# Patient Record
Sex: Female | Born: 1995
Health system: Southern US, Community
[De-identification: ages and names within clinical notes are randomized; demographics above are authoritative.]

## PROBLEM LIST (undated history)

## (undated) ENCOUNTER — Inpatient Hospital Stay (HOSPITAL_COMMUNITY): Payer: Self-pay

## (undated) ENCOUNTER — Emergency Department (HOSPITAL_BASED_OUTPATIENT_CLINIC_OR_DEPARTMENT_OTHER): Payer: Self-pay | Source: Home / Self Care

## (undated) DIAGNOSIS — Z8619 Personal history of other infectious and parasitic diseases: Secondary | ICD-10-CM

## (undated) HISTORY — DX: Personal history of other infectious and parasitic diseases: Z86.19

---

## 2016-12-01 ENCOUNTER — Ambulatory Visit: Payer: Self-pay | Admitting: Certified Nurse Midwife

## 2016-12-08 ENCOUNTER — Ambulatory Visit (INDEPENDENT_AMBULATORY_CARE_PROVIDER_SITE_OTHER): Payer: BLUE CROSS/BLUE SHIELD | Admitting: Certified Nurse Midwife

## 2016-12-08 ENCOUNTER — Encounter: Payer: Self-pay | Admitting: Certified Nurse Midwife

## 2016-12-08 VITALS — BP 125/73 | HR 93 | Ht 67.0 in | Wt 160.0 lb

## 2016-12-08 DIAGNOSIS — N926 Irregular menstruation, unspecified: Secondary | ICD-10-CM

## 2016-12-08 DIAGNOSIS — Z124 Encounter for screening for malignant neoplasm of cervix: Secondary | ICD-10-CM | POA: Diagnosis not present

## 2016-12-08 DIAGNOSIS — Z30011 Encounter for initial prescription of contraceptive pills: Secondary | ICD-10-CM

## 2016-12-08 DIAGNOSIS — Z01419 Encounter for gynecological examination (general) (routine) without abnormal findings: Secondary | ICD-10-CM

## 2016-12-08 DIAGNOSIS — N898 Other specified noninflammatory disorders of vagina: Secondary | ICD-10-CM

## 2016-12-08 DIAGNOSIS — Z3202 Encounter for pregnancy test, result negative: Secondary | ICD-10-CM | POA: Diagnosis not present

## 2016-12-08 DIAGNOSIS — Z113 Encounter for screening for infections with a predominantly sexual mode of transmission: Secondary | ICD-10-CM | POA: Diagnosis not present

## 2016-12-08 LAB — POCT URINE PREGNANCY: PREG TEST UR: NEGATIVE

## 2016-12-08 MED ORDER — NORETHIN-ETH ESTRAD-FE BIPHAS 1 MG-10 MCG / 10 MCG PO TABS: 1.0000 | ORAL_TABLET | Freq: Every day | ORAL | 4 refills | Status: DC

## 2016-12-08 NOTE — Progress Notes (Signed)
Pt would like to discuss BC options.   

## 2016-12-09 NOTE — Progress Notes (Signed)
Subjective:        Allison Ryan is a 21 y.o. female here for a routine exam.  Current complaints: irregular periods since starting to have cycles at the age of 21 years.  States that they occur around every 6 weeks and last 3-9 days with normal bleeding when they occur.  Denies any dysmenorrhea/clots, etc.  States that she is sexually active, uses condoms.  Does not want children right now.  Declines IUD/Nexplanon/Nuva Ring.  Has not been evaluated for her irregular periods or late menarche in the past.  Has not had a pap smear before.  Does report change in vaginal discharge.  STD prevention given.    Reviewed all forms of birth control options available including abstinence; over the counter/barrier methods; hormonal contraceptive medication including pill, patch, ring, injection,contraceptive implant; hormonal and nonhormonal IUDs; permanent sterilization options including vasectomy and the various tubal sterilization modalities. Risks and benefits reviewed.  Questions were answered.  Information was given to patient to review.      Personal health questionnaire:  Is patient Ashkenazi Jewish, have a family history of breast and/or ovarian cancer: no Is there a family history of uterine cancer diagnosed at age < 6350, gastrointestinal cancer, urinary tract cancer, family member who is a Personnel officerLynch syndrome-associated carrier: no Is the patient overweight and hypertensive, family history of diabetes, personal history of gestational diabetes, preeclampsia or PCOS: no Is patient over 4655, have PCOS,  family history of premature CHD under age 21, diabetes, smoke, have hypertension or peripheral artery disease:  no At any time, has a partner hit, kicked or otherwise hurt or frightened you?: no Over the past 2 weeks, have you felt down, depressed or hopeless?: no Over the past 2 weeks, have you felt little interest or pleasure in doing things?:no   Gynecologic History Patient's last menstrual period  was 11/28/2016. Contraception: condoms Last Pap: n/a 1st pap today. States that her sister had cervical cancer? Cells removed sounds like colposcopy Last mammogram: n/a <21 years, no significant hx.   Obstetric History OB History  Gravida Para Term Preterm AB Living  0 0 0 0 0 0  SAB TAB Ectopic Multiple Live Births  0 0 0 0 0        Past Medical History:  Diagnosis Date  . History of chlamydia     History reviewed. No pertinent surgical history.   Current Outpatient Medications:  .  Norethindrone-Ethinyl Estradiol-Fe Biphas (LO LOESTRIN FE) 1 MG-10 MCG / 10 MCG tablet, Take 1 tablet by mouth daily. Take 1 tablet by mouth daily., Disp: 3 Package, Rfl: 4 Not on File  Social History   Tobacco Use  . Smoking status: Never Smoker  . Smokeless tobacco: Never Used  Substance Use Topics  . Alcohol use: Yes    Family History  Problem Relation Age of Onset  . Hypertension Paternal Grandmother       Review of Systems  Constitutional: negative for fatigue and weight loss Respiratory: negative for cough and wheezing Cardiovascular: negative for chest pain, fatigue and palpitations Gastrointestinal: negative for abdominal pain and change in bowel habits Musculoskeletal:negative for myalgias Neurological: negative for gait problems and tremors Behavioral/Psych: negative for abusive relationship, depression Endocrine: negative for temperature intolerance    Genitourinary:negative for abnormal menstrual periods, genital lesions, hot flashes, sexual problems and vaginal discharge Integument/breast: negative for breast lump, breast tenderness, nipple discharge and skin lesion(s)    Objective:       BP 125/73  Pulse 93   Ht 5\' 7"  (1.702 m)   Wt 160 lb (72.6 kg)   LMP 11/28/2016   BMI 25.06 kg/m  General:   alert  Skin:   no rash or abnormalities  Lungs:   clear to auscultation bilaterally  Heart:   regular rate and rhythm, S1, S2 normal, no murmur, click, rub or gallop   Breasts:   normal without suspicious masses, skin or nipple changes or axillary nodes  Abdomen:  normal findings: no organomegaly, soft, non-tender and no hernia  Pelvis:  External genitalia: normal general appearance Urinary system: urethral meatus normal and bladder without fullness, nontender Vaginal: normal without tenderness, induration or masses Cervix: normal appearance Adnexa: normal bimanual exam Uterus: anteverted and non-tender, normal size   Lab Review Urine pregnancy test Labs reviewed yes Radiologic studies reviewed no  50% of 45 min visit spent on counseling and coordination of care.   Assessment & Plan    Healthy female exam.   R/O PCOS  1. Well woman exam   - Cytology - PAP - POCT urine pregnancy  2. Vaginal discharge     - Cervicovaginal ancillary only  3. Screen for STD (sexually transmitted disease)   - Cervicovaginal ancillary only - HIV antibody - Hepatitis B surface antigen - RPR - Hepatitis C antibody  4. Irregular periods   - TSH - Prolactin - Testosterone, Free, Total, SHBG - 17-Hydroxyprogesterone - Progesterone - Hemoglobin A1c - CBC with Differential/Platelet - Comprehensive metabolic panel - US PELVIC COMPLETE WITH TRANSVAGINAL; Future - Norethindrone-Ethinyl Estradiol-Fe Biphas (LO LOESTRIN FE) 1 MG-10 MCG / 10 MCG tablet; Take 1 tablet by mouth daily. Take 1 tablet by mouth daily.  Dispense: 3 Package; Refill: 4  5. Encounter for initial prescription of contraceptive pills   - Norethindrone-Ethinyl Estradiol-Fe Biphas (LO LOESTRIN FE) 1 MG-10 MCG / 10 MCG tablet; Take 1 tablet by mouth daily. Take 1 tablet by mouth daily.  Dispense: 3 Package; Refill: 4 - POCT urine pregnancy   Education reviewed: calcium supplements, depression evaluation, low fat, low cholesterol diet, safe sex/STD prevention, self breast exams, skin cancer screening and weight bearing exercise. Contraception: OCP (estrogen/progesterone). Follow up in: 3  months.   Meds ordered this encounter  Medications  . Norethindrone-Ethinyl Estradiol-Fe Biphas (LO LOESTRIN FE) 1 MG-10 MCG / 10 MCG tablet    Sig: Take 1 tablet by mouth daily. Take 1 tablet by mouth daily.    Dispense:  3 Package    Refill:  4    BIN # [id number], PCN# [id number], [id number], ID#: [id number]: [id number]   Orders Placed This Encounter  Procedures  . US PELVIC COMPLETE WITH TRANSVAGINAL    Standing Status:   Future    Standing Expiration Date:   02/08/2018    Order Specific Question:   Reason for Exam (SYMPTOM  OR DIAGNOSIS REQUIRED)    Answer:   irregular periods, late onset of menses    Order Specific Question:   Preferred [facility]?    [facility]  . HIV antibody  . Hepatitis B surface antigen  . RPR  . Hepatitis C antibody  . TSH  . Prolactin  . Testosterone, Free, Total, SHBG  . 17-Hydroxyprogesterone  . Progesterone  . Hemoglobin A1c  . CBC with Differential/Platelet  . Comprehensive metabolic panel  . POCT urine pregnancy    Possible management options include:Endocrinology consult for late menarche Follow up in 3 months for surveillance of OCPs/f/u lab work/US.

## 2016-12-13 LAB — CERVICOVAGINAL ANCILLARY ONLY
Bacterial vaginitis: NEGATIVE
CANDIDA VAGINITIS: NEGATIVE
CHLAMYDIA, DNA PROBE: NEGATIVE
Neisseria Gonorrhea: NEGATIVE
Trichomonas: NEGATIVE

## 2016-12-13 LAB — CYTOLOGY - PAP: DIAGNOSIS: NEGATIVE

## 2016-12-14 ENCOUNTER — Other Ambulatory Visit: Payer: Self-pay | Admitting: Certified Nurse Midwife

## 2016-12-14 ENCOUNTER — Ambulatory Visit (HOSPITAL_COMMUNITY)
Admission: RE | Admit: 2016-12-14 | Discharge: 2016-12-14 | Disposition: A | Discharge status: Home / Self Care | Payer: BLUE CROSS/BLUE SHIELD | Source: Ambulatory Visit | Attending: Certified Nurse Midwife | Admitting: Certified Nurse Midwife

## 2016-12-14 DIAGNOSIS — N926 Irregular menstruation, unspecified: Secondary | ICD-10-CM | POA: Diagnosis present

## 2016-12-16 LAB — CBC WITH DIFFERENTIAL/PLATELET
BASOS ABS: 0 10*3/uL (ref 0.0–0.2)
BASOS: 0 %
EOS (ABSOLUTE): 0.1 10*3/uL (ref 0.0–0.4)
Eos: 1 %
HEMATOCRIT: 41.5 % (ref 34.0–46.6)
Hemoglobin: 14.3 g/dL (ref 11.1–15.9)
Immature Grans (Abs): 0 10*3/uL (ref 0.0–0.1)
Immature Granulocytes: 0 %
LYMPHS ABS: 2.9 10*3/uL (ref 0.7–3.1)
Lymphs: 45 %
MCH: 30.1 pg (ref 26.6–33.0)
MCHC: 34.5 g/dL (ref 31.5–35.7)
MCV: 87 fL (ref 79–97)
MONOCYTES: 5 %
Monocytes Absolute: 0.3 10*3/uL (ref 0.1–0.9)
Neutrophils Absolute: 3.1 10*3/uL (ref 1.4–7.0)
Neutrophils: 49 %
PLATELETS: 306 10*3/uL (ref 150–379)
RBC: 4.75 x10E6/uL (ref 3.77–5.28)
RDW: 12.8 % (ref 12.3–15.4)
WBC: 6.3 10*3/uL (ref 3.4–10.8)

## 2016-12-16 LAB — COMPREHENSIVE METABOLIC PANEL
A/G RATIO: 1.6 (ref 1.2–2.2)
ALT: 15 IU/L (ref 0–32)
AST: 21 IU/L (ref 0–40)
Albumin: 4.9 g/dL (ref 3.5–5.5)
Alkaline Phosphatase: 64 IU/L (ref 39–117)
BILIRUBIN TOTAL: 0.2 mg/dL (ref 0.0–1.2)
BUN / CREAT RATIO: 6 — AB (ref 9–23)
BUN: 6 mg/dL (ref 6–20)
CALCIUM: 9.9 mg/dL (ref 8.7–10.2)
CHLORIDE: 119 mmol/L — AB (ref 96–106)
CO2: 17 mmol/L — ABNORMAL LOW (ref 20–29)
Creatinine, Ser: 1.05 mg/dL — ABNORMAL HIGH (ref 0.57–1.00)
GFR, EST AFRICAN AMERICAN: 88 mL/min/{1.73_m2} (ref >59)
GFR, EST NON AFRICAN AMERICAN: 76 mL/min/{1.73_m2} (ref >59)
GLOBULIN, TOTAL: 3.1 g/dL (ref 1.5–4.5)
Glucose: 89 mg/dL (ref 65–99)
POTASSIUM: 4.8 mmol/L (ref 3.5–5.2)
SODIUM: 160 mmol/L — AB (ref 134–144)
TOTAL PROTEIN: 8 g/dL (ref 6.0–8.5)

## 2016-12-16 LAB — 17-HYDROXYPROGESTERONE: 17-Hydroxyprogesterone: 23 ng/dL

## 2016-12-16 LAB — TESTOSTERONE, FREE, TOTAL, SHBG
Sex Hormone Binding: 56.9 nmol/L (ref 24.6–122.0)
TESTOSTERONE FREE: 2.1 pg/mL (ref 0.0–4.2)
TESTOSTERONE: 106 ng/dL — AB (ref 8–48)

## 2016-12-16 LAB — TSH: TSH: 0.604 u[IU]/mL (ref 0.450–4.500)

## 2016-12-16 LAB — HIV ANTIBODY (ROUTINE TESTING W REFLEX): HIV Screen 4th Generation wRfx: NONREACTIVE

## 2016-12-16 LAB — HEMOGLOBIN A1C
ESTIMATED AVERAGE GLUCOSE: 114 mg/dL
HEMOGLOBIN A1C: 5.6 % (ref 4.8–5.6)

## 2016-12-16 LAB — HEPATITIS B SURFACE ANTIGEN: Hepatitis B Surface Ag: NEGATIVE

## 2016-12-16 LAB — PROGESTERONE: Progesterone: <0.1 ng/mL

## 2016-12-16 LAB — PROLACTIN: Prolactin: 7.9 ng/mL (ref 4.8–23.3)

## 2016-12-16 LAB — HEPATITIS C ANTIBODY

## 2016-12-16 LAB — RPR: RPR Ser Ql: NONREACTIVE

## 2016-12-20 ENCOUNTER — Ambulatory Visit: Payer: BLUE CROSS/BLUE SHIELD | Admitting: Certified Nurse Midwife

## 2016-12-22 ENCOUNTER — Encounter: Payer: Self-pay | Admitting: Certified Nurse Midwife

## 2016-12-22 ENCOUNTER — Ambulatory Visit (INDEPENDENT_AMBULATORY_CARE_PROVIDER_SITE_OTHER): Payer: BLUE CROSS/BLUE SHIELD | Admitting: Certified Nurse Midwife

## 2016-12-22 VITALS — BP 135/84 | HR 84 | Ht 67.0 in | Wt 169.0 lb

## 2016-12-22 DIAGNOSIS — N898 Other specified noninflammatory disorders of vagina: Secondary | ICD-10-CM

## 2016-12-22 DIAGNOSIS — B373 Candidiasis of vulva and vagina: Secondary | ICD-10-CM

## 2016-12-22 DIAGNOSIS — Z113 Encounter for screening for infections with a predominantly sexual mode of transmission: Secondary | ICD-10-CM

## 2016-12-22 DIAGNOSIS — B3731 Acute candidiasis of vulva and vagina: Secondary | ICD-10-CM

## 2016-12-22 MED ORDER — FLUCONAZOLE 200 MG PO TABS: 200.0000 mg | ORAL_TABLET | Freq: Once | ORAL | 0 refills | Status: AC

## 2016-12-22 MED ORDER — TERCONAZOLE 0.8 % VA CREA: 1.0000 | TOPICAL_CREAM | Freq: Every day | VAGINAL | 0 refills | Status: DC

## 2016-12-22 NOTE — Progress Notes (Signed)
Complains of creamy white, malodorous discharge after having unprotected sex 2 weeks ago. Denies pain, NV, itching.

## 2016-12-22 NOTE — Progress Notes (Signed)
Patient ID: Allison MadeiraKayla N Stensland, female   DOB: March 18, 1995, 21 y.o.   MRN: [id number]  Chief Complaint  Patient presents with  . Vaginal Discharge    HPI Allison Ryan is a 21 y.o. female.  Here for f/u with change in vaginal discharge that started after her last exam.  Had unprotected sexual intercourse about 2 weeks ago.  Is worried about STI infection  HPI  Past Medical History:  Diagnosis Date  . History of chlamydia     History reviewed. No pertinent surgical history.  Family History  Problem Relation Age of Onset  . Hypertension Paternal Grandmother     Social History Social History   Tobacco Use  . Smoking status: Never Smoker  . Smokeless tobacco: Never Used  Substance Use Topics  . Alcohol use: Yes  . Drug use: No    No Known Allergies  Current Outpatient Medications  Medication Sig Dispense Refill  . fluconazole (DIFLUCAN) 200 MG tablet Take 1 tablet (200 mg total) by mouth once for 1 dose. Repeat dose in 48-72 hours. 3 tablet 0  . Norethindrone-Ethinyl Estradiol-Fe Biphas (LO LOESTRIN FE) 1 MG-10 MCG / 10 MCG tablet Take 1 tablet by mouth daily. Take 1 tablet by mouth daily. (Patient not taking: Reported on 12/22/2016) 3 Package 4  . terconazole (TERAZOL 3) 0.8 % vaginal cream Place 1 applicator vaginally at bedtime. 20 g 0   No current facility-administered medications for this visit.     Review of Systems Review of Systems Constitutional: negative for fatigue and weight loss Respiratory: negative for cough and wheezing Cardiovascular: negative for chest pain, fatigue and palpitations Gastrointestinal: negative for abdominal pain and change in bowel habits Genitourinary:negative Integument/breast: negative for nipple discharge Musculoskeletal:negative for myalgias Neurological: negative for gait problems and tremors Behavioral/Psych: negative for abusive relationship, depression Endocrine: negative for temperature intolerance      Blood pressure  135/84, pulse 84, height 5\' 7"  (1.702 m), weight 169 lb (76.7 kg), last menstrual period 11/28/2016.  Physical Exam Physical Exam General:   alert  Skin:   no rash or abnormalities  Lungs:   clear to auscultation bilaterally  Heart:   regular rate and rhythm, S1, S2 normal, no murmur, click, rub or gallop  Breasts:   deferred  Abdomen:  normal findings: no organomegaly, soft, non-tender and no hernia  Pelvis:  External genitalia: normal general appearance Urinary system: urethral meatus normal and bladder without fullness, nontender Vaginal: normal without tenderness, induration or masses, + white, thick, chunky vaginal discharge     50% of 15 min visit spent on counseling and coordination of care.   Data Reviewed Previous medical hx, meds, labs  Assessment     1. Vaginal discharge     - Cervicovaginal ancillary only  2. Yeast vaginitis    - fluconazole (DIFLUCAN) 200 MG tablet; Take 1 tablet (200 mg total) by mouth once for 1 dose. Repeat dose in 48-72 hours.  Dispense: 3 tablet; Refill: 0 - terconazole (TERAZOL 3) 0.8 % vaginal cream; Place 1 applicator vaginally at bedtime.  Dispense: 20 g; Refill: 0     Plan     Meds ordered this encounter  Medications  . fluconazole (DIFLUCAN) 200 MG tablet    Sig: Take 1 tablet (200 mg total) by mouth once for 1 dose. Repeat dose in 48-72 hours.    Dispense:  3 tablet    Refill:  0  . terconazole (TERAZOL 3) 0.8 % vaginal cream    Sig:  Place 1 applicator vaginally at bedtime.    Dispense:  20 g    Refill:  0

## 2016-12-27 LAB — CERVICOVAGINAL ANCILLARY ONLY
Bacterial vaginitis: POSITIVE — AB
CANDIDA VAGINITIS: NEGATIVE
CHLAMYDIA, DNA PROBE: NEGATIVE
Neisseria Gonorrhea: NEGATIVE
TRICH (WINDOWPATH): NEGATIVE

## 2017-01-09 ENCOUNTER — Other Ambulatory Visit: Payer: Self-pay | Admitting: Certified Nurse Midwife

## 2017-01-09 DIAGNOSIS — N760 Acute vaginitis: Principal | ICD-10-CM

## 2017-01-09 DIAGNOSIS — B9689 Other specified bacterial agents as the cause of diseases classified elsewhere: Secondary | ICD-10-CM

## 2017-01-09 MED ORDER — TINIDAZOLE 500 MG PO TABS: 2.0000 g | ORAL_TABLET | Freq: Every day | ORAL | 0 refills | Status: DC

## 2017-04-13 ENCOUNTER — Ambulatory Visit: Payer: BLUE CROSS/BLUE SHIELD | Admitting: Certified Nurse Midwife

## 2017-04-27 ENCOUNTER — Encounter: Payer: Self-pay | Admitting: Certified Nurse Midwife

## 2017-04-27 ENCOUNTER — Ambulatory Visit (INDEPENDENT_AMBULATORY_CARE_PROVIDER_SITE_OTHER): Payer: Self-pay | Admitting: Certified Nurse Midwife

## 2017-04-27 VITALS — BP 127/84 | HR 88 | Ht 67.0 in | Wt 164.0 lb

## 2017-04-27 DIAGNOSIS — N926 Irregular menstruation, unspecified: Secondary | ICD-10-CM

## 2017-04-27 DIAGNOSIS — Z113 Encounter for screening for infections with a predominantly sexual mode of transmission: Secondary | ICD-10-CM

## 2017-04-27 DIAGNOSIS — N898 Other specified noninflammatory disorders of vagina: Secondary | ICD-10-CM

## 2017-04-27 DIAGNOSIS — Z30011 Encounter for initial prescription of contraceptive pills: Secondary | ICD-10-CM

## 2017-04-27 MED ORDER — NORETHIN-ETH ESTRAD-FE BIPHAS 1 MG-10 MCG / 10 MCG PO TABS: 1.0000 | ORAL_TABLET | Freq: Every day | ORAL | 4 refills | Status: DC

## 2017-04-27 NOTE — Progress Notes (Signed)
Patient reports thick white discharge for 2 weeks.

## 2017-04-27 NOTE — Progress Notes (Signed)
Patient ID: Allison Ryan, female   DOB: 1995-05-13, 22 y.o.   MRN: [id number]  Chief Complaint  Patient presents with  . GYN    HPI Allison Ryan is a 22 y.o. female.  Here for f/u on vaginal discharge.  Has a hx of BV.  Had normal pelvic US in December 2018.  Periods are still irregular.  Normal labs except for Testosterone.  No other symptoms of PCOS.  Has not used Loloestrin yet.  Samples given and RX card. Has new partner since January/Febuary.  Uses condoms.  LMP 3/19. Declines STD blood work.    HPI  Past Medical History:  Diagnosis Date  . History of chlamydia     No past surgical history on file.  Family History  Problem Relation Age of Onset  . Hypertension Paternal Grandmother     Social History Social History   Tobacco Use  . Smoking status: Never Smoker  . Smokeless tobacco: Never Used  Substance Use Topics  . Alcohol use: Yes  . Drug use: No    No Known Allergies  Current Outpatient Medications  Medication Sig Dispense Refill  . Norethindrone-Ethinyl Estradiol-Fe Biphas (LO LOESTRIN FE) 1 MG-10 MCG / 10 MCG tablet Take 1 tablet by mouth daily. Take 1 tablet by mouth daily. 3 Package 4   No current facility-administered medications for this visit.     Review of Systems Review of Systems Constitutional: negative for fatigue and weight loss Respiratory: negative for cough and wheezing Cardiovascular: negative for chest pain, fatigue and palpitations Gastrointestinal: negative for abdominal pain and change in bowel habits Genitourinary:+vaginal discharge Integument/breast: negative for nipple discharge Musculoskeletal:negative for myalgias Neurological: negative for gait problems and tremors Behavioral/Psych: negative for abusive relationship, depression Endocrine: negative for temperature intolerance      Blood pressure 127/84, pulse 88, height 5\' 7"  (1.702 m), weight 164 lb (74.4 kg), last menstrual period 03/28/2017.  Physical Exam Physical  Exam General:   alert  Skin:   no rash or abnormalities  Lungs:   clear to auscultation bilaterally  Heart:   regular rate and rhythm, S1, S2 normal, no murmur, click, rub or gallop  Breasts:   deferred  Abdomen:  normal findings: no organomegaly, soft, non-tender and no hernia  Pelvis:  External genitalia: normal general appearance Urinary system: urethral meatus normal and bladder without fullness, nontender Vaginal: normal without tenderness, induration or masses Cervix: declined Adnexa: declined Uterus: declined    50% of 20 min visit spent on counseling and coordination of care.   Data Reviewed Previous medical hx, meds, labs, [place]  Assessment     1. Irregular periods   - Norethindrone-Ethinyl Estradiol-Fe Biphas (LO LOESTRIN FE) 1 MG-10 MCG / 10 MCG tablet; Take 1 tablet by mouth daily. Take 1 tablet by mouth daily.  Dispense: 3 Package; Refill: 4  2. Encounter for initial prescription of contraceptive pills    - Norethindrone-Ethinyl Estradiol-Fe Biphas (LO LOESTRIN FE) 1 MG-10 MCG / 10 MCG tablet; Take 1 tablet by mouth daily. Take 1 tablet by mouth daily.  Dispense: 3 Package; Refill: 4  3. Vaginal discharge      - Cervicovaginal ancillary only     Plan    Probiotics  Meds ordered this encounter  Medications  . Norethindrone-Ethinyl Estradiol-Fe Biphas (LO LOESTRIN FE) 1 MG-10 MCG / 10 MCG tablet    Sig: Take 1 tablet by mouth daily. Take 1 tablet by mouth daily.    Dispense:  3 Package  Refill:  4    BIN # [id number], PCN# [id number], [id number]#[id number], ID#: [id number]      Follow up as needed for labial skin tag removal

## 2017-04-28 LAB — CERVICOVAGINAL ANCILLARY ONLY
BACTERIAL VAGINITIS: POSITIVE — AB
CANDIDA VAGINITIS: NEGATIVE
Chlamydia: NEGATIVE
Neisseria Gonorrhea: NEGATIVE
Trichomonas: NEGATIVE

## 2017-05-01 ENCOUNTER — Other Ambulatory Visit: Payer: Self-pay | Admitting: Certified Nurse Midwife

## 2017-05-01 ENCOUNTER — Ambulatory Visit: Payer: Self-pay | Admitting: Certified Nurse Midwife

## 2017-05-01 DIAGNOSIS — B9689 Other specified bacterial agents as the cause of diseases classified elsewhere: Secondary | ICD-10-CM

## 2017-05-01 DIAGNOSIS — N760 Acute vaginitis: Principal | ICD-10-CM

## 2017-05-01 MED ORDER — METRONIDAZOLE 0.75 % VA GEL: 1.0000 | VAGINAL | 4 refills | Status: DC

## 2017-05-01 MED ORDER — SECNIDAZOLE 2 G PO PACK: 1.0000 | PACK | Freq: Once | ORAL | 0 refills | Status: AC

## 2017-05-03 ENCOUNTER — Telehealth: Payer: Self-pay | Admitting: *Deleted

## 2017-05-03 ENCOUNTER — Ambulatory Visit: Payer: Self-pay | Admitting: Certified Nurse Midwife

## 2017-05-03 NOTE — Telephone Encounter (Signed)
Attempted to call patient to inform of cost of visit today, no answer voicemail box full.Allison KitchenMarland Ryan

## 2017-08-25 DIAGNOSIS — Z113 Encounter for screening for infections with a predominantly sexual mode of transmission: Secondary | ICD-10-CM | POA: Diagnosis not present

## 2017-10-16 DIAGNOSIS — N899 Noninflammatory disorder of vagina, unspecified: Secondary | ICD-10-CM | POA: Diagnosis not present

## 2017-10-16 DIAGNOSIS — Z113 Encounter for screening for infections with a predominantly sexual mode of transmission: Secondary | ICD-10-CM | POA: Diagnosis not present

## 2017-10-16 DIAGNOSIS — L918 Other hypertrophic disorders of the skin: Secondary | ICD-10-CM | POA: Diagnosis not present

## 2017-10-18 DIAGNOSIS — D281 Benign neoplasm of vagina: Secondary | ICD-10-CM | POA: Diagnosis not present

## 2017-10-19 DIAGNOSIS — D281 Benign neoplasm of vagina: Secondary | ICD-10-CM | POA: Diagnosis not present

## 2017-10-22 ENCOUNTER — Other Ambulatory Visit: Payer: Self-pay

## 2017-10-22 ENCOUNTER — Encounter (HOSPITAL_COMMUNITY): Payer: Self-pay | Admitting: Emergency Medicine

## 2017-10-22 ENCOUNTER — Emergency Department (HOSPITAL_COMMUNITY)
Admission: EM | Admit: 2017-10-22 | Discharge: 2017-10-22 | Disposition: A | Discharge status: Home / Self Care | Payer: Self-pay | Attending: Emergency Medicine | Admitting: Emergency Medicine

## 2017-10-22 DIAGNOSIS — Z79899 Other long term (current) drug therapy: Secondary | ICD-10-CM | POA: Insufficient documentation

## 2017-10-22 DIAGNOSIS — L0231 Cutaneous abscess of buttock: Secondary | ICD-10-CM | POA: Insufficient documentation

## 2017-10-22 MED ORDER — SULFAMETHOXAZOLE-TRIMETHOPRIM 800-160 MG PO TABS
1.0000 | ORAL_TABLET | Freq: Once | ORAL | Status: AC
  Administered 2017-10-22: 1 via ORAL
  Filled 2017-10-22: qty 1

## 2017-10-22 MED ORDER — CEPHALEXIN 500 MG PO CAPS
500.0000 mg | ORAL_CAPSULE | Freq: Once | ORAL | Status: AC
  Administered 2017-10-22: 500 mg via ORAL
  Filled 2017-10-22: qty 1

## 2017-10-22 MED ORDER — CEPHALEXIN 500 MG PO CAPS: 500.0000 mg | ORAL_CAPSULE | Freq: Four times a day (QID) | ORAL | 0 refills | Status: DC

## 2017-10-22 MED ORDER — SULFAMETHOXAZOLE-TRIMETHOPRIM 800-160 MG PO TABS: 1.0000 | ORAL_TABLET | Freq: Two times a day (BID) | ORAL | 0 refills | Status: AC

## 2017-10-22 MED ORDER — LIDOCAINE HCL 2 % IJ SOLN
20.0000 mL | Freq: Once | INTRAMUSCULAR | Status: AC
Start: 2017-10-22 — End: 2017-10-22
  Administered 2017-10-22: 40 mg
  Filled 2017-10-22: qty 20

## 2017-10-22 NOTE — ED Triage Notes (Signed)
Pt c/o abscess near rectum. Pt states she noticed it 10 days ago and it has recently started to drain.

## 2017-10-22 NOTE — ED Provider Notes (Signed)
[facility]-EMERGENCY DEPT Provider Note   CSN: [id number] Arrival date & time: 10/22/17  1223     History   Chief Complaint Chief Complaint  Patient presents with  . Abscess    HPI Allison Ryan is a 22 y.o. female who presents to the ED with c/o abscess. The symptoms started 10 days ago and have gotten worse. The abscess is located near the rectum. Patient reports it has recently started to drain. Patient denies fever, n/v or other problems. Patient has had I&D of same area in the past.  The history is provided by the patient. No language interpreter was used.  Abscess  Location:  Pelvis Pelvic abscess location:  L buttock Abscess quality: draining, painful, redness and warmth   Red streaking: no   Duration:  1 day Progression:  Worsening Pain details:    Quality:  Burning and sharp   Severity:  Moderate   Timing:  Constant   Progression:  Worsening Chronicity:  Recurrent Relieved by:  Nothing Worsened by:  Nothing Risk factors: prior abscess     Past Medical History:  Diagnosis Date  . History of chlamydia     There are no active problems to display for this patient.   History reviewed. No pertinent surgical history.   OB History    Gravida  0   Para  0   Term  0   Preterm  0   AB  0   Living  0     SAB  0   TAB  0   Ectopic  0   Multiple  0   Live Births  0            Home Medications    Prior to Admission medications   Medication Sig Start Date End Date Taking? Authorizing Provider  cephALEXin (KEFLEX) 500 MG capsule Take 1 capsule (500 mg total) by mouth 4 (four) times daily. 10/22/17   Janne Napoleon, NP  metroNIDAZOLE (METROGEL VAGINAL) 0.75 % vaginal gel Place 1 Applicatorful vaginally 2 (two) times a week. For 4-6 months. 05/01/17   Orvilla Cornwall A, CNM  Norethindrone-Ethinyl Estradiol-Fe Biphas (LO LOESTRIN FE) 1 MG-10 MCG / 10 MCG tablet Take 1 tablet by mouth daily. Take 1 tablet by mouth daily.  04/27/17   Orvilla Cornwall A, CNM  sulfamethoxazole-trimethoprim (BACTRIM DS,SEPTRA DS) 800-160 MG tablet Take 1 tablet by mouth 2 (two) times daily for 7 days. 10/22/17 10/29/17  Janne Napoleon, NP    Family History Family History  Problem Relation Age of Onset  . Hypertension Paternal Grandmother     Social History Social History   Tobacco Use  . Smoking status: Never Smoker  . Smokeless tobacco: Never Used  Substance Use Topics  . Alcohol use: Yes  . Drug use: No     Allergies   Patient has no known allergies.   Review of Systems Review of Systems  Skin: Positive for wound.  All other systems reviewed and are negative.    Physical Exam Updated Vital Signs BP (!) 147/100 (BP Location: Right Arm)   Pulse (!) 102   Temp 99 F (37.2 C) (Oral)   Resp 16   LMP 09/25/2017 (Exact Date)   SpO2 100%   Physical Exam  Constitutional: She appears well-developed and well-nourished. No distress.  HENT:  Head: Normocephalic.  Eyes: EOM are normal.  Neck: Neck supple.  Cardiovascular: Tachycardia present.  Pulmonary/Chest: Effort normal.  Genitourinary:  Genitourinary Comments: Tender raised area to the left buttock with erythema, scant drainage.   Musculoskeletal: Normal range of motion.  Neurological: She is alert.  Skin: Skin is warm and dry.  Psychiatric: She has a normal mood and affect.  Nursing note and vitals reviewed.    ED Treatments / Results  Labs (all labs ordered are listed, but only abnormal results are displayed) Labs Reviewed - No data to display  Radiology No results found.  Procedures .Marland KitchenIncision and Drainage Date/Time: 10/22/2017 1:14 PM Performed by: Janne Napoleon, NP Authorized by: Janne Napoleon, NP   Consent:    Consent obtained:  Verbal   Consent given by:  Patient   Risks discussed:  Incomplete drainage, bleeding and pain   Alternatives discussed:  Alternative treatment Location:    Type:  Abscess   Location:   Anogenital   Anogenital location: left buttock. Pre-procedure details:    Skin preparation:  Betadine Anesthesia (see MAR for exact dosages):    Anesthesia method:  Local infiltration   Local anesthetic:  Lidocaine 2% w/o epi Procedure type:    Complexity:  Complex Procedure details:    Needle aspiration: no     Incision types:  Single straight   Incision depth:  Dermal   Scalpel blade:  11   Wound management:  Probed and deloculated and irrigated with saline   Drainage:  Purulent and bloody   Drainage amount:  Scant   Packing materials:  1/4 in gauze Post-procedure details:    Patient tolerance of procedure:  Tolerated well, no immediate complications   (including critical care time)  Medications Ordered in ED Medications  sulfamethoxazole-trimethoprim (BACTRIM DS,SEPTRA DS) 800-160 MG per tablet 1 tablet (has no administration in time range)  cephALEXin (KEFLEX) capsule 500 mg (has no administration in time range)  lidocaine (XYLOCAINE) 2 % (with pres) injection 400 mg (40 mg Infiltration Given 10/22/17 1308)     Initial Impression / Assessment and Plan / ED Course  I have reviewed the triage vital signs and the nursing notes. 22 y.o. female with recurrent abscess to the left buttock stable for d/c without fever and does not appear toxic. Area drained and packed with iodoform gauze. Patient to do sitz baths and f/u in 2 days for packing removal. Referral to general surgery.   Final Clinical Impressions(s) / ED Diagnoses   Final diagnoses:  Abscess of buttock, left    ED Discharge Orders         Ordered    cephALEXin (KEFLEX) 500 MG capsule  4 times daily     10/22/17 1309    sulfamethoxazole-trimethoprim (BACTRIM DS,SEPTRA DS) 800-160 MG tablet  2 times daily     10/22/17 1309           Damian Leavell [place], NP 10/22/17 1318    Virgina Norfolk, DO 10/22/17 1459

## 2017-10-22 NOTE — Discharge Instructions (Addendum)
Apply warm wet compresses to the area or sit in tubs of warm water 3 or 4 times a day for about 20 minutes. Follow up in 2 days for packing removal and recheck.

## 2018-01-30 ENCOUNTER — Encounter: Payer: Self-pay | Admitting: Obstetrics & Gynecology

## 2018-01-30 ENCOUNTER — Other Ambulatory Visit: Payer: Self-pay | Admitting: Obstetrics & Gynecology

## 2018-01-30 ENCOUNTER — Ambulatory Visit: Payer: Managed Care, Other (non HMO) | Admitting: Obstetrics & Gynecology

## 2018-01-30 VITALS — BP 141/87 | HR 102 | Ht 67.0 in | Wt 174.7 lb

## 2018-01-30 DIAGNOSIS — Z113 Encounter for screening for infections with a predominantly sexual mode of transmission: Secondary | ICD-10-CM | POA: Diagnosis not present

## 2018-01-30 DIAGNOSIS — A64 Unspecified sexually transmitted disease: Secondary | ICD-10-CM | POA: Diagnosis not present

## 2018-01-30 DIAGNOSIS — B373 Candidiasis of vulva and vagina: Secondary | ICD-10-CM

## 2018-01-30 DIAGNOSIS — N898 Other specified noninflammatory disorders of vagina: Secondary | ICD-10-CM

## 2018-01-30 DIAGNOSIS — O209 Hemorrhage in early pregnancy, unspecified: Secondary | ICD-10-CM

## 2018-01-30 LAB — POCT URINE PREGNANCY: Preg Test, Ur: POSITIVE — AB

## 2018-01-30 NOTE — Progress Notes (Signed)
History:  23 y.o. G0P0000 here today for eval of irregular discharge. LMP 01/15/2018. Pt is not sure if this was a normal cycle. Pt is not sure when the cycle prior to that was. Pt reports a h/o STIs but, says this is different than anything she has had. Her partner ejaculated inside her vagina so she used Pan B but thinks it might have been too late. She reports that it was within 72 hours. Pt denies NEW pain in the pelvis.  Pt uses the pull out method for contraception.   Pt does not desire pregnancy. NO CURRENT OR NEW PAIN.  The following portions of the patient's history were reviewed and updated as appropriate: allergies, current medications, past family history, past medical history, past social history, past surgical history and problem list.  Review of Systems:  Pertinent items are noted in HPI.    Objective:  Physical Exam Weight 174 lb 11.2 oz (79.2 kg), last menstrual period 01/20/2018.  CONSTITUTIONAL: Well-developed, well-nourished female in no acute distress.  HENT:  Normocephalic, atraumatic EYES: Conjunctivae and EOM are normal. No scleral icterus.  NECK: Normal range of motion SKIN: Skin is warm and dry. No rash noted. Not diaphoretic.No pallor. NEUROLGIC: Alert and oriented to person, place, and time. Normal coordination.  Abd: Soft, nontender and nondistended Pelvic: Normal appearing external genitalia; normal appearing vaginal mucosa and cervix- no CMT. The cervix is friable and there is a small amount of blood stained vaginal discharge.   Small uterus, no other palpable masses, no uterine or adnexal tenderness  UPT:  POS  Assessment & Plan:  vaginal discharge.   cx sent   Recommend condoms with intercourse  Bleeding in eaarly pregnancy/pregnancy of unknown location  Quant HCG today  F/u in 48 hours for repeat HCG, May need [place] pending HCG results.     Total face-to-face time with patient was 20 min.  Greater than 50% was spent in counseling and coordination of  care with the patient.  Ademola Vert L. Harraway-Smith, M.D., [place]

## 2018-01-30 NOTE — Progress Notes (Signed)
Pt presents for intermittent and sharp LLQ pain, worst after eating x 2 days.  Pt also c/o "pinkish grayish" vaginal discharge x 1.5 weeks. Denies itching and odor

## 2018-01-31 LAB — CERVICOVAGINAL ANCILLARY ONLY
Bacterial vaginitis: NEGATIVE
Candida vaginitis: POSITIVE — AB
Chlamydia: NEGATIVE
Neisseria Gonorrhea: NEGATIVE
TRICH (WINDOWPATH): NEGATIVE

## 2018-01-31 LAB — BETA HCG QUANT (REF LAB): HCG QUANT: 429 m[IU]/mL

## 2018-02-01 ENCOUNTER — Other Ambulatory Visit: Payer: Managed Care, Other (non HMO)

## 2018-02-01 DIAGNOSIS — O209 Hemorrhage in early pregnancy, unspecified: Secondary | ICD-10-CM

## 2018-02-02 LAB — BETA HCG QUANT (REF LAB): HCG QUANT: 672 m[IU]/mL

## 2018-02-03 ENCOUNTER — Inpatient Hospital Stay (HOSPITAL_COMMUNITY)
Admission: AD | Admit: 2018-02-03 | Discharge: 2018-02-03 | Disposition: A | Discharge status: Home / Self Care | Payer: BLUE CROSS/BLUE SHIELD | Source: Ambulatory Visit | Attending: Obstetrics and Gynecology | Admitting: Obstetrics and Gynecology

## 2018-02-03 ENCOUNTER — Inpatient Hospital Stay (HOSPITAL_COMMUNITY): Payer: BLUE CROSS/BLUE SHIELD

## 2018-02-03 ENCOUNTER — Encounter (HOSPITAL_COMMUNITY): Payer: Self-pay | Admitting: *Deleted

## 2018-02-03 DIAGNOSIS — O209 Hemorrhage in early pregnancy, unspecified: Secondary | ICD-10-CM | POA: Diagnosis present

## 2018-02-03 DIAGNOSIS — O208 Other hemorrhage in early pregnancy: Secondary | ICD-10-CM

## 2018-02-03 DIAGNOSIS — O3481 Maternal care for other abnormalities of pelvic organs, first trimester: Secondary | ICD-10-CM | POA: Insufficient documentation

## 2018-02-03 DIAGNOSIS — N83201 Unspecified ovarian cyst, right side: Secondary | ICD-10-CM | POA: Insufficient documentation

## 2018-02-03 DIAGNOSIS — Z3A01 Less than 8 weeks gestation of pregnancy: Secondary | ICD-10-CM | POA: Insufficient documentation

## 2018-02-03 DIAGNOSIS — O26891 Other specified pregnancy related conditions, first trimester: Secondary | ICD-10-CM | POA: Insufficient documentation

## 2018-02-03 DIAGNOSIS — O3680X0 Pregnancy with inconclusive fetal viability, not applicable or unspecified: Secondary | ICD-10-CM

## 2018-02-03 DIAGNOSIS — N83202 Unspecified ovarian cyst, left side: Secondary | ICD-10-CM | POA: Diagnosis not present

## 2018-02-03 LAB — HCG, QUANTITATIVE, PREGNANCY: hCG, Beta Chain, Quant, S: 1081 m[IU]/mL — ABNORMAL HIGH (ref <5)

## 2018-02-03 NOTE — Progress Notes (Signed)
Jessica Emly CNM in earlier to discuss test results and d/c plan. Written and verbal d/c instructions given and understanding voiced. 

## 2018-02-03 NOTE — MAU Provider Note (Signed)
History     CSN: [id number]  Arrival date and time: 02/03/18 1630   None     Chief Complaint  Patient presents with  . Follow-up   Allison Ryan is a 23 y.o. G1P0 who presents for follow up testing.  She had a positive pregnancy test on 2/28 after presenting for abnormal vaginal discharge.  Patient has been presenting every 48 hours for evaluation of quant levels for identification of pregnancy location.  Patient reports vaginal bleeding requiring usage of a tampon and intermittent cramping.  She does report a desire to terminate the pregnancy.    Previous Quants: 1/28: 429 1/30: 672     OB History    Gravida  1   Para  0   Term  0   Preterm  0   AB  0   Living  0     SAB  0   TAB  0   Ectopic  0   Multiple  0   Live Births  0           Past Medical History:  Diagnosis Date  . History of chlamydia     No past surgical history on file.  Family History  Problem Relation Age of Onset  . Hypertension Paternal Grandmother   . Hypertension Mother   . Cancer Maternal Grandfather        Pt unsure which cancer     Social History   Tobacco Use  . Smoking status: Never Smoker  . Smokeless tobacco: Never Used  Substance Use Topics  . Alcohol use: Yes    Comment: social   . Drug use: No    Allergies: No Known Allergies  Medications Prior to Admission  Medication Sig Dispense Refill Last Dose  . cephALEXin (KEFLEX) 500 MG capsule Take 1 capsule (500 mg total) by mouth 4 (four) times daily. (Patient not taking: Reported on 01/30/2018) 20 capsule 0 Not Taking  . metroNIDAZOLE (METROGEL VAGINAL) 0.75 % vaginal gel Place 1 Applicatorful vaginally 2 (two) times a week. For 4-6 months. (Patient not taking: Reported on 01/30/2018) 70 g 4 Not Taking  . Norethindrone-Ethinyl Estradiol-Fe Biphas (LO LOESTRIN FE) 1 MG-10 MCG / 10 MCG tablet Take 1 tablet by mouth daily. Take 1 tablet by mouth daily. (Patient not taking: Reported on 01/30/2018) 3 Package 4 Not  Taking    Review of Systems  Constitutional: Negative for chills and fever.  Gastrointestinal: Negative for abdominal pain.  Genitourinary: Positive for vaginal bleeding. Negative for dysuria.  Neurological: Negative for dizziness, light-headedness and headaches.   Physical Exam   Blood pressure 131/67, pulse 70, temperature 97.8 F (36.6 C), temperature source Oral, resp. rate 18, last menstrual period 01/20/2018, SpO2 99 %.  Physical Exam  Constitutional: She is oriented to person, place, and time. She appears well-developed and well-nourished. No distress.  HENT:  Head: Normocephalic and atraumatic.  Eyes: Conjunctivae are normal.  Neck: Normal range of motion.  Cardiovascular: Normal rate.  Respiratory: Effort normal.  GI: Soft.  Musculoskeletal: Normal range of motion.  Neurological: She is alert and oriented to person, place, and time.  Skin: Skin is warm and dry.  Psychiatric: She has a normal mood and affect. Her behavior is normal.    MAU Course  Procedures Results for orders placed or performed during the hospital encounter of 02/03/18 (from the past 24 hour(s))  hCG, quantitative, pregnancy     Status: Abnormal   Collection Time: 02/03/18  4:50 PM  Result Value Ref Range   hCG, Beta Chain, Quant, S 1,081 (H) <5 mIU/mL   FINDINGS: Intrauterine gestational sac: None  Yolk sac:  Not Visualized.  Embryo:  Not Visualized.  Cardiac Activity: Not Visualized.  Maternal uterus/adnexae:  Uterus: Uterus is retroflexed and retroverted. Visualization is somewhat limited due to position. No endometrial fluid collections identified. Endometrial stripe thickness measures about 7 mm. No myometrial mass lesions.  Right ovary measures 5.3 x 5.2 x 4.3 cm and contains a simple cyst measuring 5 cm maximal diameter. Cyst is new since previous study. Flow is demonstrated in the right ovarian tissue on color flow Doppler imaging.  Left ovary measures 5.7 x 6.5 x 5.2 cm  and contains a complex cystic structure with lace-like internal echoes measuring 5.2 cm maximal diameter. Appearance is consistent with a hemorrhagic cyst. Cyst is new since previous study. Flow is demonstrated within the left ovarian tissue on color flow Doppler imaging.  Small amount of free fluid in the pelvis.  IMPRESSION: 1. No intrauterine gestational sac, yolk sac, or fetal pole identified. Differential considerations include intrauterine pregnancy too early to be sonographically visualized, missed abortion, or ectopic pregnancy. Followup ultrasound is recommended in 10-14 days for further evaluation.  2. 5 cm diameter simple appearing cyst in the right ovary, likely Physiologic.  3. 5.2 cm diameter complex cyst in the left ovary consistent with hemorrhagic cyst, likely physiologic.  MDM Repeat Quant TVUS Assessment and Plan  Pregnancy of unknown anatomic location  -Quant results return at 1081. -Patient and mother informed of possibility of identify anatomical location via [place]. -TVUS ordered -Will await results  Follow Up (2030) Bilateral Simple Cysts  -[place] results significant for simple cysts, but no IUP visualized -Results discussed with patient and mother who expresses concern regarding simple cysts -Informed that cysts incidental finding that is not currently contributing to vaginal bleeding or cramping. -Educated on simple cysts and symptoms associated with rupture if they were to occur. Further informed that surgical management is rare. -Discussed inability of [place] to reveal location of pregnancy and informed of normalcy of this. -Educated on necessity of locating pregnancy as a means of providing proper management for therapeutic abortion vs ectopic pregnancy. -Informed that therapeutic abortion not performed in MAU for live IUPs. -Discussed repeat hCG in 2 days to reassess levels and perform repeat US if necessary. -Dr. Chana Bode consulted and advised: *Schedule  for repeat US in 2-3 days as quant should be at 2000 range or above and pregnancy location should be identifiable at that time. -Patient and mother informed of POC and agreeable. -[place] order placed for expected arrival of 02/06/2018 -Bleeding and Ectopic Precautions Reviewed -No other q/c -Encouraged to return to MAU if symptoms worsen or onset of new symptoms -Requested and given school excuse for office visit on Thursday 1/30 -Discharged to home in stable condition  Cherre Robins MSN, CNM 02/03/2018, 7:19 PM

## 2018-02-03 NOTE — MAU Note (Signed)
Allison Ryan is a 23 y.o.here in MAU reporting: for follow up HCG labs Pain score: 2/10. Intermittent lower abdominal cramping that is the same as previous visit Vaginal bleeding: reports is same as before Vitals:   02/03/18 1654  BP: 131/67  Pulse: 70  Resp: 18  Temp: 97.8 F (36.6 C)  SpO2: 99%      Patient to lobby to await results.

## 2018-02-06 ENCOUNTER — Telehealth: Payer: Self-pay | Admitting: General Practice

## 2018-02-06 ENCOUNTER — Other Ambulatory Visit: Payer: Managed Care, Other (non HMO)

## 2018-02-06 NOTE — Telephone Encounter (Signed)
Patient called and left message on nurse voicemail line stating she was seen in the MAU on Saturday and is scheduled for an ultrasound to determine pregnancy location. Patient states she has an appt on  2/6 and doesn't know what is going on or what is occurring at this appt.  Called patient stating I am returning her phone call and explained she has her ultrasound appt and someone will bring her to our office afterwards to go over the results.  Patient verbalized understanding & asked about time, location & how to prepare. Told patient her ultrasound is at 8am, to arrive 15 minutes prior with a full bladder & provided location. Patient verbalized understanding to all & had no questions.

## 2018-02-06 NOTE — MAU Note (Signed)
Pt's mother had questions about follow up plan. Pt's mother confirmed pt's name, birthday, and address before any information was given. Informed pt's mother that she needs to call radiology number provided on AVS about follow up u/s since they did not call her yesterday. Copy of AVS and excuse letter provided to pt's mother.

## 2018-02-08 ENCOUNTER — Ambulatory Visit (INDEPENDENT_AMBULATORY_CARE_PROVIDER_SITE_OTHER): Payer: BLUE CROSS/BLUE SHIELD | Admitting: Obstetrics and Gynecology

## 2018-02-08 ENCOUNTER — Ambulatory Visit (HOSPITAL_COMMUNITY)
Admission: RE | Admit: 2018-02-08 | Discharge: 2018-02-08 | Disposition: A | Discharge status: Home / Self Care | Payer: BLUE CROSS/BLUE SHIELD | Source: Ambulatory Visit

## 2018-02-08 ENCOUNTER — Encounter: Payer: Self-pay | Admitting: Obstetrics and Gynecology

## 2018-02-08 DIAGNOSIS — O3680X0 Pregnancy with inconclusive fetal viability, not applicable or unspecified: Secondary | ICD-10-CM | POA: Diagnosis not present

## 2018-02-08 DIAGNOSIS — O209 Hemorrhage in early pregnancy, unspecified: Secondary | ICD-10-CM | POA: Diagnosis not present

## 2018-02-08 DIAGNOSIS — Z3A00 Weeks of gestation of pregnancy not specified: Secondary | ICD-10-CM | POA: Diagnosis not present

## 2018-02-08 NOTE — Progress Notes (Signed)
Allison Ryan presents for f/u U/S results d/t to pregnancy of unknown location. See prior notes and test results in chart.  U/S today essentially unchanged from 02/03/18  She reports some mild cramps and moderate vaginal bleeding  Test results reviewed with pt and mother. Questions answered  A/P Pregnancy of unknown location  Management options reviewed with pt. Pt intends to terminate pregnancy no matter outcome. Will check BHCG , non stat. Will contact pt tomorrow with results. Pending results additional Tx as indicated. If remains abnormal, pt desires MTX therapy.

## 2018-02-10 ENCOUNTER — Inpatient Hospital Stay (HOSPITAL_COMMUNITY)
Admission: AD | Admit: 2018-02-10 | Discharge: 2018-02-10 | Disposition: A | Discharge status: Home / Self Care | Payer: BLUE CROSS/BLUE SHIELD | Source: Ambulatory Visit | Attending: Obstetrics and Gynecology | Admitting: Obstetrics and Gynecology

## 2018-02-10 DIAGNOSIS — Z6791 Unspecified blood type, Rh negative: Secondary | ICD-10-CM

## 2018-02-10 DIAGNOSIS — O00109 Unspecified tubal pregnancy without intrauterine pregnancy: Secondary | ICD-10-CM | POA: Insufficient documentation

## 2018-02-10 DIAGNOSIS — O26891 Other specified pregnancy related conditions, first trimester: Secondary | ICD-10-CM | POA: Diagnosis not present

## 2018-02-10 DIAGNOSIS — O3680X0 Pregnancy with inconclusive fetal viability, not applicable or unspecified: Secondary | ICD-10-CM | POA: Diagnosis not present

## 2018-02-10 DIAGNOSIS — Z3A00 Weeks of gestation of pregnancy not specified: Secondary | ICD-10-CM | POA: Diagnosis not present

## 2018-02-10 LAB — ABO AND RH: RH TYPE: NEGATIVE

## 2018-02-10 LAB — COMPREHENSIVE METABOLIC PANEL
A/G RATIO: 1.8 (ref 1.2–2.2)
ALK PHOS: 50 IU/L (ref 39–117)
ALK PHOS: 47 U/L (ref 38–126)
ALT: 16 IU/L (ref 0–32)
ALT: 21 U/L (ref 0–44)
ANION GAP: 7 (ref 5–15)
AST: 13 IU/L (ref 0–40)
AST: 21 U/L (ref 15–41)
Albumin: 4.2 g/dL (ref 3.9–5.0)
Albumin: 4.2 g/dL (ref 3.5–5.0)
BILIRUBIN TOTAL: 0.4 mg/dL (ref 0.0–1.2)
BUN / CREAT RATIO: 6 — AB (ref 9–23)
BUN: 5 mg/dL — AB (ref 6–20)
BUN: 8 mg/dL (ref 6–20)
CALCIUM: 8.6 mg/dL — AB (ref 8.9–10.3)
CHLORIDE: 105 mmol/L (ref 96–106)
CO2: 21 mmol/L (ref 20–29)
CO2: 26 mmol/L (ref 22–32)
CREATININE: 0.84 mg/dL (ref 0.44–1.00)
Calcium: 8.9 mg/dL (ref 8.7–10.2)
Chloride: 104 mmol/L (ref 98–111)
Creatinine, Ser: 0.86 mg/dL (ref 0.57–1.00)
GFR calc non Af Amer: 96 mL/min/{1.73_m2} (ref >59)
GFR, EST AFRICAN AMERICAN: 111 mL/min/{1.73_m2} (ref >59)
GLUCOSE: 86 mg/dL (ref 65–99)
Globulin, Total: 2.4 g/dL (ref 1.5–4.5)
Glucose, Bld: 76 mg/dL (ref 70–99)
POTASSIUM: 4 mmol/L (ref 3.5–5.2)
Potassium: 3.7 mmol/L (ref 3.5–5.1)
Sodium: 140 mmol/L (ref 134–144)
Sodium: 137 mmol/L (ref 135–145)
TOTAL PROTEIN: 6.6 g/dL (ref 6.0–8.5)
Total Bilirubin: 0.6 mg/dL (ref 0.3–1.2)
Total Protein: 7.9 g/dL (ref 6.5–8.1)

## 2018-02-10 LAB — CBC
HCT: 41.1 % (ref 36.0–46.0)
HEMOGLOBIN: 13.6 g/dL (ref 12.0–15.0)
MCH: 29.6 pg (ref 26.0–34.0)
MCHC: 33.1 g/dL (ref 30.0–36.0)
MCV: 89.3 fL (ref 80.0–100.0)
Platelets: 268 10*3/uL (ref 150–400)
RBC: 4.6 MIL/uL (ref 3.87–5.11)
RDW: 12.2 % (ref 11.5–15.5)
WBC: 6.6 10*3/uL (ref 4.0–10.5)
nRBC: 0 % (ref 0.0–0.2)

## 2018-02-10 LAB — BETA HCG QUANT (REF LAB): HCG QUANT: 2102 m[IU]/mL

## 2018-02-10 LAB — HCG, QUANTITATIVE, PREGNANCY: hCG, Beta Chain, Quant, S: 3096 m[IU]/mL — ABNORMAL HIGH (ref <5)

## 2018-02-10 LAB — ANTIBODY SCREEN: ANTIBODY SCREEN: NEGATIVE

## 2018-02-10 MED ORDER — METHOTREXATE INJECTION FOR WOMEN'S HOSPITAL
50.0000 mg/m2 | Freq: Once | INTRAMUSCULAR | Status: AC
  Administered 2018-02-10: 100 mg via INTRAMUSCULAR
  Filled 2018-02-10: qty 2

## 2018-02-10 MED ORDER — RHO D IMMUNE GLOBULIN 1500 UNIT/2ML IJ SOSY
300.0000 ug | PREFILLED_SYRINGE | Freq: Once | INTRAMUSCULAR | Status: AC
  Administered 2018-02-10: 300 ug via INTRAMUSCULAR
  Filled 2018-02-10: qty 2

## 2018-02-10 NOTE — MAU Provider Note (Addendum)
[name]  is a 23 y.o. G1P0000 at Unknown who presents to MAU today for MTX therapy for suspected ectopic pregnancy. Quant HCG has been 429>672>1081>2102 and no IUP on [place]. She was counseled yesterday about MTX therapy and agrees to proceed.  She endorses no pain, vaginal bleeding or fever today.   OB History  Gravida Para Term Preterm AB Living  1 0 0 0 0 0  SAB TAB Ectopic Multiple Live Births  0 0 0 0 0    # Outcome Date GA Lbr Len/2nd Weight Sex Delivery Anes PTL Lv  1 Current             Past Medical History:  Diagnosis Date  . History of chlamydia     ROS: no VB no pain  BP 136/67 (BP Location: Right Arm)   Pulse 65   Temp 98.2 F (36.8 C) (Oral)   Resp 18   Ht 5\' 7"  (1.702 m)   Wt 81.2 kg   LMP 01/20/2018   SpO2 100% Comment: ra  BMI 28.05 kg/m   CONSTITUTIONAL: Well-developed, well-nourished female in no acute distress.  MUSCULOSKELETAL: Normal range of motion.  CARDIOVASCULAR: Regular heart rate RESPIRATORY: Normal effort NEUROLOGICAL: Alert and oriented to person, place, and time.  SKIN: Not diaphoretic. No erythema. No pallor. PSYCH: Normal mood and affect. Normal behavior. Normal judgment and thought content.  Results for orders placed or performed during the hospital encounter of 02/10/18 (from the past 24 hour(s))  CBC     Status: None   Collection Time: 02/10/18  4:28 PM  Result Value Ref Range   WBC 6.6 4.0 - 10.5 K/uL   RBC 4.60 3.87 - 5.11 MIL/uL   Hemoglobin 13.6 12.0 - 15.0 g/dL   HCT 16.3 84.5 - 36.4 %   MCV 89.3 80.0 - 100.0 fL   MCH 29.6 26.0 - 34.0 pg   MCHC 33.1 30.0 - 36.0 g/dL   RDW 68.0 32.1 - 22.4 %   Platelets 268 150 - 400 K/uL   nRBC 0.0 0.0 - 0.2 %  Comprehensive metabolic panel     Status: Abnormal   Collection Time: 02/10/18  4:28 PM  Result Value Ref Range   Sodium 137 135 - 145 mmol/L   Potassium 3.7 3.5 - 5.1 mmol/L   Chloride 104 98 - 111 mmol/L   CO2 26 22 - 32 mmol/L   Glucose, Bld 76 70 - 99 mg/dL   BUN  8 6 - 20 mg/dL   Creatinine, Ser 8.25 0.44 - 1.00 mg/dL   Calcium 8.6 (L) 8.9 - 10.3 mg/dL   Total Protein 7.9 6.5 - 8.1 g/dL   Albumin 4.2 3.5 - 5.0 g/dL   AST 21 15 - 41 U/L   ALT 21 0 - 44 U/L   Alkaline Phosphatase 47 38 - 126 U/L   Total Bilirubin 0.6 0.3 - 1.2 mg/dL   GFR calc non Af Amer >60 >60 mL/min   GFR calc Af Amer >60 >60 mL/min   Anion gap 7 5 - 15  hCG, quantitative, pregnancy     Status: Abnormal   Collection Time: 02/10/18  4:28 PM  Result Value Ref Range   hCG, Beta Chain, Quant, S 3,096 (H) <5 mIU/mL  Rh IG workup (includes ABO/Rh)     Status: None (Preliminary result)   Collection Time: 02/10/18  4:28 PM  Result Value Ref Range   Gestational Age(Wks) 6    ABO/RH(D) O NEG  Antibody Screen      NEG Performed at [facility], [address] [place], [place] [address]    Unit Number [id number]    Blood Component Type RHIG    Unit division 00    Status of Unit ALLOCATED    Transfusion Status OK TO TRANSFUSE    MDM: Labs ordered and reviewed. Plan for MTX. Discussed with pt, all questions answered. Rhogam given.   Meds ordered this encounter  Medications  . methotrexate (50 mg/ml) chemo injection 100 mg  . rho (d) immune globulin (RHIG/RHOPHYLAC) injection 300 mcg   A: 1. Pregnancy, location unknown   2. Rh negative status during pregnancy in first trimester    P: Discharge home Return precautions discussed Patient will return for follow-up quant HCG in WOC on 02/14/18 Patient may return to MAU as needed or if her condition were to change or worsen   Donette Larry, [place] 02/10/2018 6:51 PM

## 2018-02-10 NOTE — Discharge Instructions (Signed)
Methotrexate Treatment for an Ectopic Pregnancy, Care After °This sheet gives you information about how to care for yourself after your procedure. Your health care provider may also give you more specific instructions. If you have problems or questions, contact your health care provider. °What can I expect after the procedure? °After the procedure, it is common to have: °· Abdominal cramping. °· Vaginal bleeding. °· Fatigue. °· Nausea. °· Vomiting. °· Diarrhea. °Blood tests will be taken at timed intervals for several days or weeks to check your pregnancy hormone levels. The blood tests will be done until the pregnancy hormone can no longer be detected in the blood. °Follow these instructions at home: °Activity °· Do not have sex until your health care provider approves. °· Limit activities that take a lot of effort as told by your health care provider. °Medicines °· Take over the counter and prescription medicines only as told by your health care provider. °· Do not take aspirin, ibuprofen, naproxen, or any other NSAIDs. °· Do not take folic acid, prenatal vitamins, or other vitamins that contain folic acid. °General instructions ° °· Do not drink alcohol. °· Follow instructions from your health care provider on how and when to report any symptoms that may indicate a ruptured ectopic pregnancy. °· Keep all follow-up visits as told by your health care provider. This is important. °Contact a health care provider if: °· You have persistent nausea and vomiting. °· You have persistent diarrhea. °· You are having a reaction to the medicine, such as: °? Tiredness. °? Skin rash. °? Hair loss. °Get help right away if: °· Your abdominal or pelvic pain gets worse. °· You have more vaginal bleeding. °· You feel light-headed or you faint. °· You have shortness of breath. °· Your heart rate increases. °· You develop a cough. °· You have chills. °· You have a fever. °Summary °· After the procedure, it is common to have symptoms  of abdominal cramping, vaginal bleeding and fatigue. You may also experience other symptoms. °· Blood tests will be taken at timed intervals for several days or weeks to check your pregnancy hormone levels. The blood tests will be done until the pregnancy hormone can no longer be detected in the blood. °· Limit strenuous activity as told by your health care provider. °· Follow instructions from your health care provider on how and when to report any symptoms that may indicate a ruptured ectopic pregnancy. °This information is not intended to replace advice given to you by your health care provider. Make sure you discuss any questions you have with your health care provider. °Document Released: 12/09/2010 Document Revised: 02/09/2016 Document Reviewed: 02/09/2016 °Elsevier Interactive Patient Education © 2019 [employer] ° °

## 2018-02-10 NOTE — MAU Note (Signed)
Allison Ryan is a 23 y.o.  here in MAU reporting:  States is here to get methotrexate. Pain score: denies Vaginal bleeding: reports is the same as before Vitals:   02/10/18 1540  BP: 136/67  Pulse: 65  Resp: 18  Temp: 98.2 F (36.8 C)  SpO2: 100%     Lab orders placed from triage: none Patient to lobby to await lab draw and injection. Melanie CNM aware of arrival.

## 2018-02-11 ENCOUNTER — Telehealth: Payer: Self-pay | Admitting: Obstetrics and Gynecology

## 2018-02-11 LAB — RH IG WORKUP (INCLUDES ABO/RH)
ABO/RH(D): O NEG
Antibody Screen: NEGATIVE
GESTATIONAL AGE(WKS): 6
Unit division: 0

## 2018-02-11 NOTE — Telephone Encounter (Signed)
Late Entry Pt contacted via phone on 02/09/18 @ 11 AM Latest BHCG result provided to pt. Pt informed she is a candidate for MTX as discussed yesterday. Pt desires. Pt instructed to come to MAU for MTX injection. MAU provider, Suzette Battiest contact and report provided.

## 2018-02-14 ENCOUNTER — Telehealth: Payer: Self-pay | Admitting: General Practice

## 2018-02-14 ENCOUNTER — Ambulatory Visit (INDEPENDENT_AMBULATORY_CARE_PROVIDER_SITE_OTHER): Payer: BLUE CROSS/BLUE SHIELD | Admitting: General Practice

## 2018-02-14 DIAGNOSIS — O00109 Unspecified tubal pregnancy without intrauterine pregnancy: Secondary | ICD-10-CM

## 2018-02-14 LAB — HCG, QUANTITATIVE, PREGNANCY: hCG, Beta Chain, Quant, S: 3519 m[IU]/mL — ABNORMAL HIGH (ref <5)

## 2018-02-14 NOTE — Progress Notes (Signed)
Patient presents to office today for stat bhcg day #4 labs following MTX. Patient reports pain has decreased to a level of 5, feels dull/achy in middle of pelvis. Also reports bleeding has slowed down to a light period. Discussed with patient we are monitoring your bhcg levels today & asked she wait in lobby for results/updated plan of care. Patient verbalized understanding & states she cannot stay because she has to go to class and has already missed enough days. Discussed with patient importance of staying and she may have to immediately return to office. Patient verbalized understanding & states she will be reachable by phone.  Reviewed results with Dr Alysia PennaErvin who finds slight rise in bhcg levels, patient should follow up on Saturday for day #7 labs.   Will call patient with results/plan.  Chase Callerarrie H RN BSN 02/14/18

## 2018-02-14 NOTE — Telephone Encounter (Signed)
Called patient with bhcg results and discussed importance of follow up on Saturday for Day #7 labs. Patient verbalized understanding & had no questions.

## 2018-02-14 NOTE — Progress Notes (Signed)
Agree with A & P. 

## 2018-02-19 ENCOUNTER — Other Ambulatory Visit: Payer: BLUE CROSS/BLUE SHIELD

## 2018-02-19 DIAGNOSIS — O00109 Unspecified tubal pregnancy without intrauterine pregnancy: Secondary | ICD-10-CM | POA: Diagnosis not present

## 2018-02-19 NOTE — Progress Notes (Unsigned)
Patient presents to office today for routine bhcg. Patient no showed to MAU on Saturday for day #7 labs following MTX. Patient reports pain 8/10 last night in middle of pelvis and is bleeding like a period. Patient reports improvement in pain today. Discussed with Dr Adrian Blackwater who recommends routine bhcg today & will contact patient with results & if second MTX is needed.

## 2018-02-20 LAB — BETA HCG QUANT (REF LAB): hCG Quant: 1709 m[IU]/mL

## 2018-02-27 ENCOUNTER — Other Ambulatory Visit: Payer: Self-pay | Admitting: *Deleted

## 2018-02-27 ENCOUNTER — Other Ambulatory Visit (INDEPENDENT_AMBULATORY_CARE_PROVIDER_SITE_OTHER): Payer: BLUE CROSS/BLUE SHIELD

## 2018-02-27 DIAGNOSIS — O00109 Unspecified tubal pregnancy without intrauterine pregnancy: Secondary | ICD-10-CM

## 2018-02-27 NOTE — Progress Notes (Signed)
Pt here today for non-stat beta s/p right tubal pregnancy with tx of methotrexate.  Pt reports that she is having pain in the pelvic area with some left sided pain that has increased since Saturday and Tylenol or Ibuprofen is not effective.  Pt also reports having vaginal bleeding that she has has been having that she changes a pad at least four times a day.  Notified Dr. Marice Potter pt's new onset of intense pain.  Provider recommendation to go to [facility] ED for ectopic evaluation NOW.  Informed pt of providers recommendation pt states that she will not be able to go today because she has to work but will try to go to tomorrow to [facility] ED.  I strongly advised that the pt go to [facility] ED asap pt again stated that she will go when she can tomorrow.  I informed pt that if she has any concerns to please give the office a call.

## 2018-02-28 ENCOUNTER — Telehealth: Payer: Self-pay

## 2018-02-28 LAB — BETA HCG QUANT (REF LAB): HCG QUANT: 406 m[IU]/mL

## 2018-02-28 NOTE — Telephone Encounter (Signed)
Pt came in to the office today for results of beta.  Notified pt results of beta and the need for her to come in on March 4th for a non-stat beta.  I verified with pt if she is still having the pain that she complained about yesterday in her visit.  She stated "yes". I asked if she would be able to go to MAU today as she stated that she would yesterday.  Pt stated that she did not want to accrue a bill unnecessarily.  I explained to the pt that she could be at risk for ectopic.  Pt requested to have something stronger than Tylenol for pain.  Notified Dr. Jolayne Panther who stated that she does recommended anything stronger because it will mask her pain if she is truly having an ectopic pregnancy.  Notified pt provider's recommendation and to please f/u in one week for non-stat beta lab.  Pt stated that she will just monitor her sx's on her own and come next for lab draw.

## 2018-02-28 NOTE — Telephone Encounter (Signed)
-----   Message from Hermina Staggers, MD sent at 02/28/2018  8:31 AM EST ----- BHCG in 1 week Thanks Casimiro Needle

## 2018-03-01 NOTE — Progress Notes (Signed)
I have reviewed this chart and agree with the RN/CMA assessment and management.    Judea Fennimore C Wrenn Willcox, MD, FACOG Attending Physician, [facility]  

## 2018-03-05 IMAGING — US US PELVIS COMPLETE TRANSABD/TRANSVAG
1 series · 15 of 25 positions shown · non-contrast
Comparison: None

CLINICAL DATA: Irregular menses for 2 years.  Late onset of menses.

EXAM:
TRANSABDOMINAL AND TRANSVAGINAL ULTRASOUND OF PELVIS
TECHNIQUE: Both transabdominal and transvaginal ultrasound examinations of the
pelvis were performed. Transabdominal technique was performed for
global imaging of the pelvis including uterus, ovaries, adnexal
regions, and pelvic cul-de-sac. It was necessary to proceed with
endovaginal exam following the transabdominal exam to visualize the
endometrium and ovaries.

[Series 1: us pelvis complete transabd/transvag · 15 of 49 slices shown]
[im 1/49]
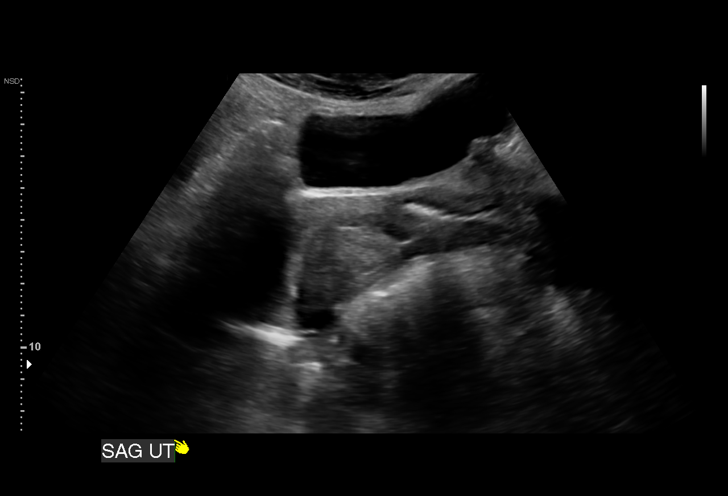
[im 5/49]
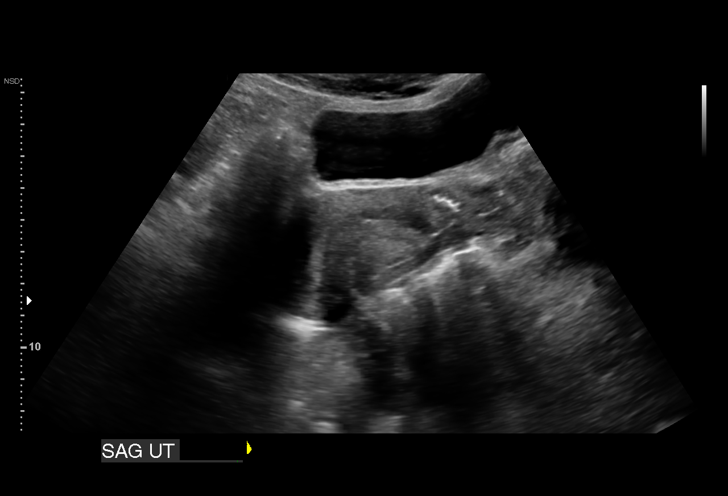
[im 9/49]
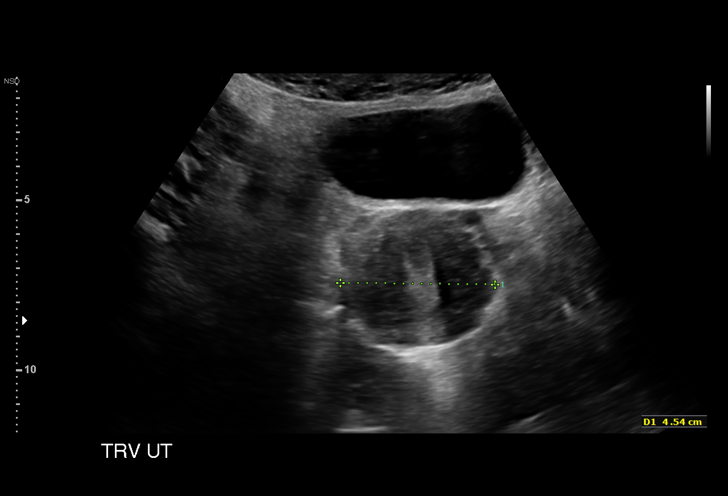
[im 11/49]
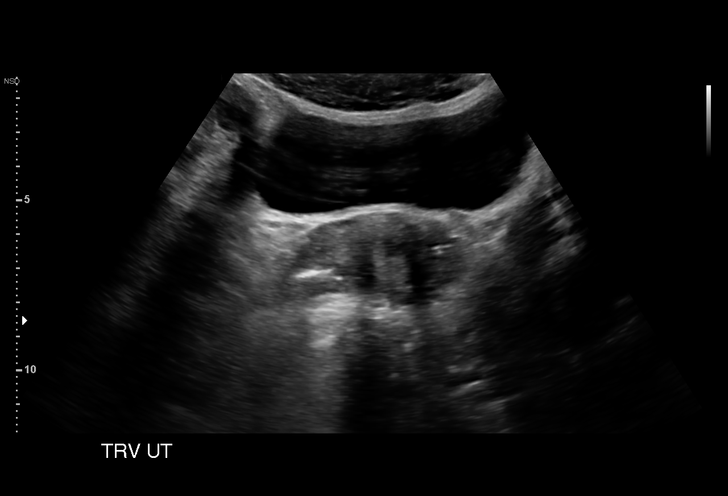
[im 15/49]
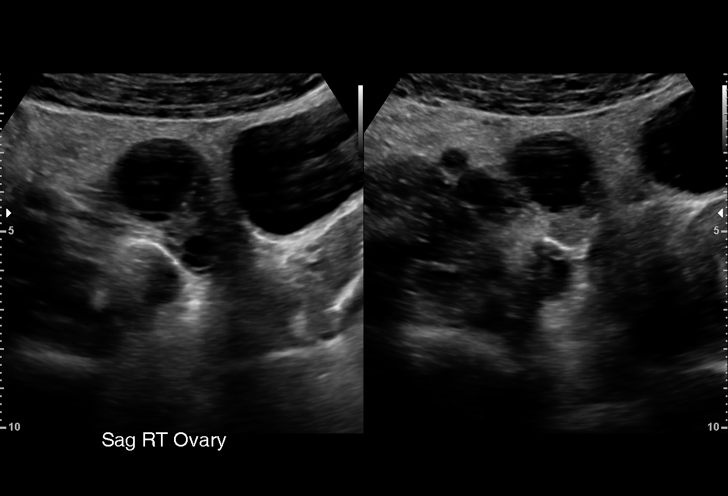
[im 19/49]
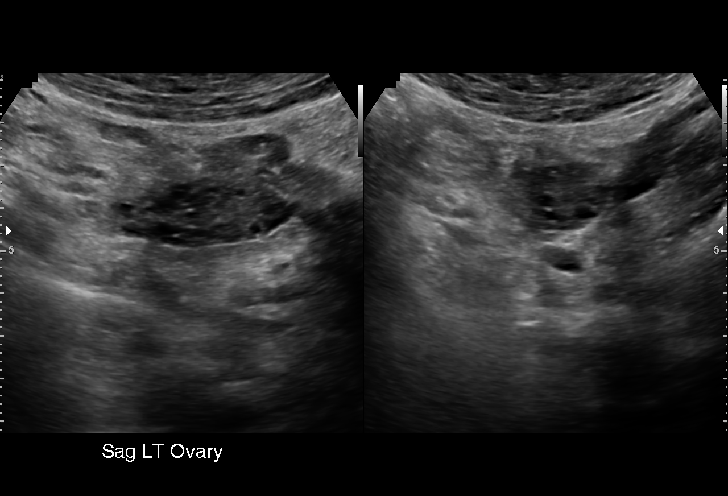
[im 21/49]
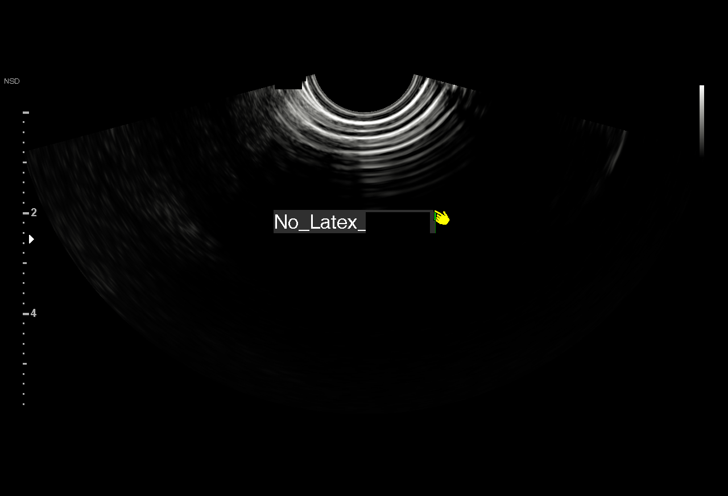
[im 25/49]
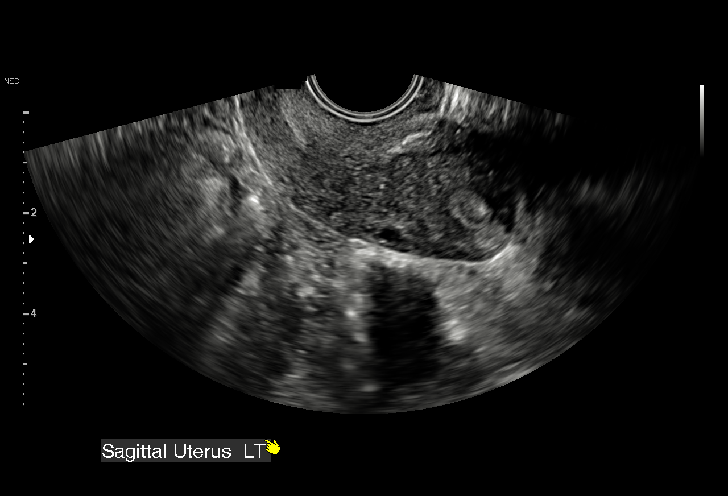
[im 29/49]
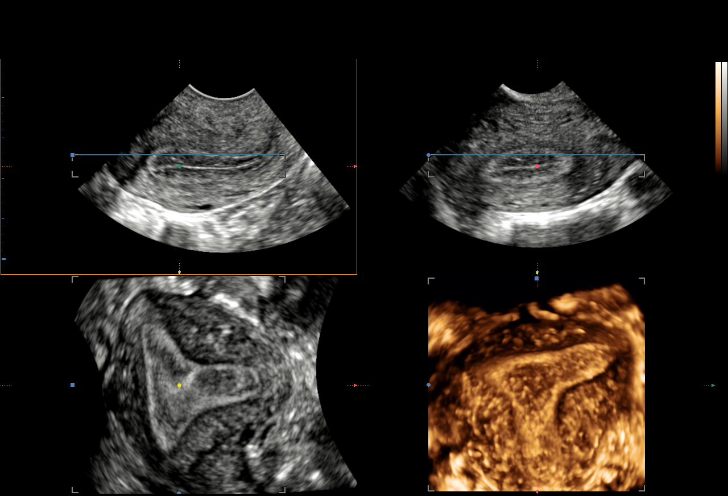
[im 31/49]
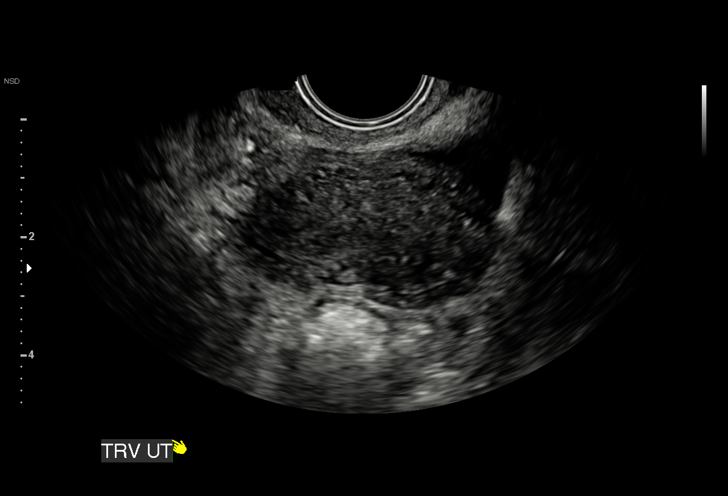
[im 35/49]
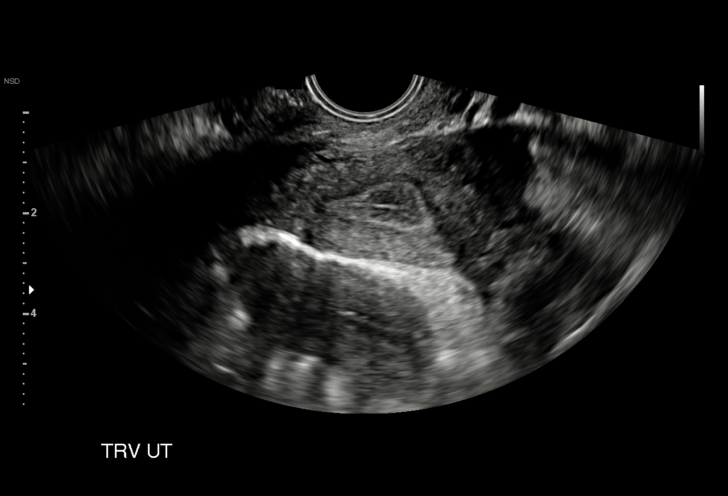
[im 39/49]
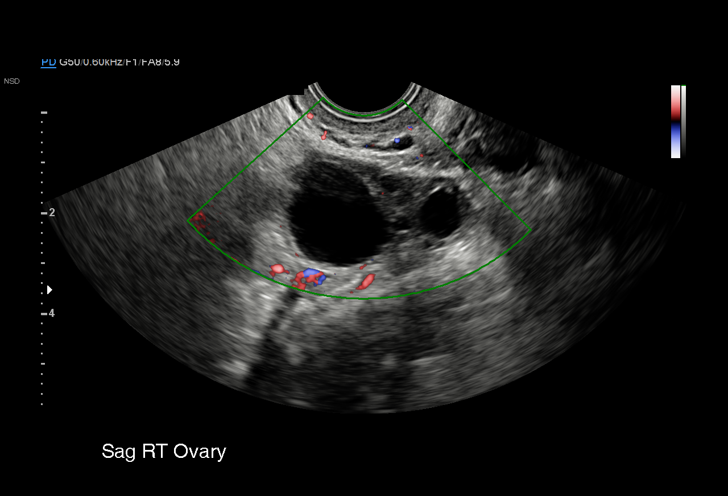
[im 41/49]
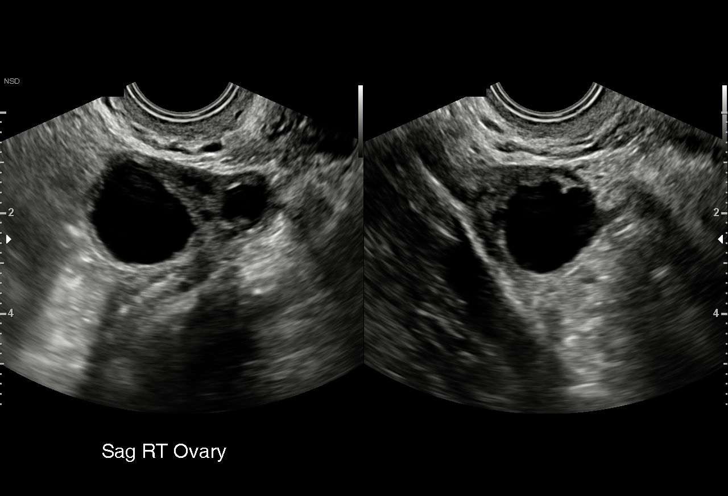
[im 45/49]
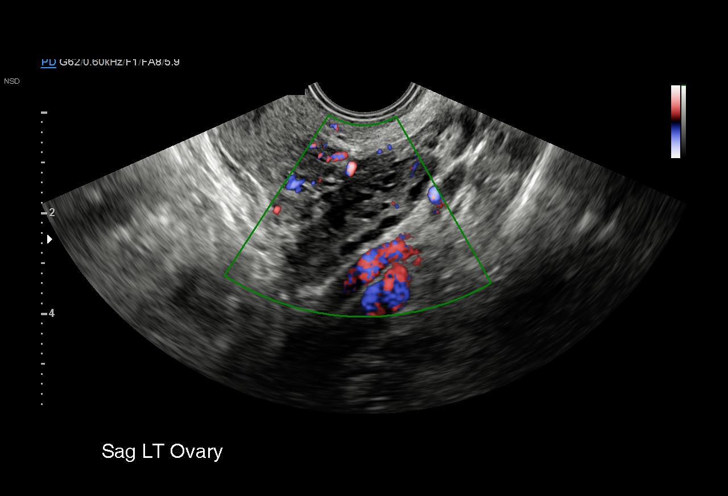
[im 49/49]
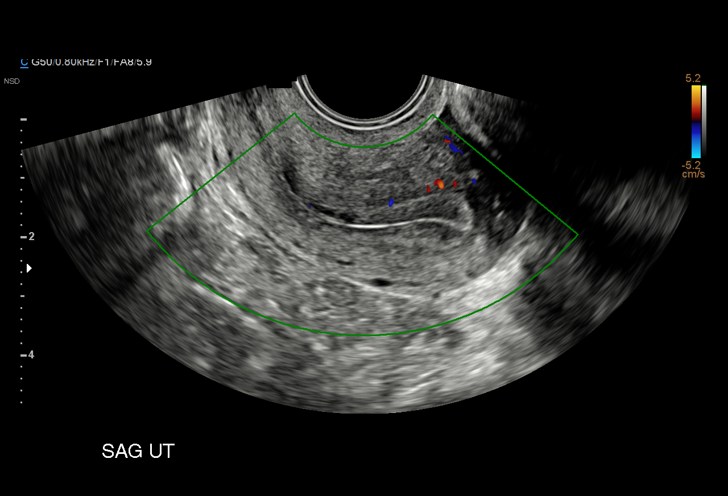

[15 of 25 positions shown; findings below may reference images not displayed]

FINDINGS: Uterus

Measurements: 6.4 x 2.7 x 4.8 cm. Retroverted. No fibroids or other
mass visualized.

Endometrium

Thickness: 9 mm. Tri layered appearance. No focal abnormality
visualized.

Right ovary

Measurements: 3.9 x 2.7 x 2.6 cm. Normal appearance/no adnexal mass.

Left ovary

Measurements: 4.0 x 1.2 x 1.9 cm. Normal appearance/no adnexal mass.

Other findings

No abnormal free fluid.
IMPRESSION: No evidence of uterine fibroids. Endometrial thickness measures 9
mm.

Normal appearance of both ovaries.  No adnexal mass identified.

## 2018-03-06 ENCOUNTER — Other Ambulatory Visit: Payer: BLUE CROSS/BLUE SHIELD

## 2018-03-06 DIAGNOSIS — O00109 Unspecified tubal pregnancy without intrauterine pregnancy: Secondary | ICD-10-CM

## 2018-03-07 ENCOUNTER — Other Ambulatory Visit: Payer: BLUE CROSS/BLUE SHIELD

## 2018-03-07 ENCOUNTER — Telehealth: Payer: Self-pay | Admitting: *Deleted

## 2018-03-07 LAB — BETA HCG QUANT (REF LAB): hCG Quant: 60 m[IU]/mL

## 2018-03-07 NOTE — Telephone Encounter (Signed)
-----   Message from Hermina Staggers, MD sent at 03/07/2018 10:49 AM EST ----- BHCG in 1 week Thanks Casimiro Needle

## 2018-03-07 NOTE — Telephone Encounter (Signed)
I called Allison Ryan and notified her of her results of  Her bhcg from yesterday (60) and that she should keep her already scheduled lab appointment for another non stat bhcg. She voices understanding and thanked me for the call.

## 2018-03-08 ENCOUNTER — Other Ambulatory Visit: Payer: Self-pay | Admitting: *Deleted

## 2018-03-08 DIAGNOSIS — O3680X0 Pregnancy with inconclusive fetal viability, not applicable or unspecified: Secondary | ICD-10-CM

## 2018-03-12 ENCOUNTER — Other Ambulatory Visit: Payer: BLUE CROSS/BLUE SHIELD

## 2018-03-12 DIAGNOSIS — O3680X0 Pregnancy with inconclusive fetal viability, not applicable or unspecified: Secondary | ICD-10-CM

## 2018-03-13 LAB — BETA HCG QUANT (REF LAB): HCG QUANT: 10 m[IU]/mL

## 2018-03-14 ENCOUNTER — Telehealth: Payer: Self-pay

## 2018-03-14 NOTE — Telephone Encounter (Addendum)
-----   Message from Hermina Staggers, MD sent at 03/14/2018  1:50 PM EDT ----- Repeat BHCG in 2 weeks Thanks Casimiro Needle  Notified pt of results and the need for an appt.  Pt stated that she will be able to come on 03/28/18 @ 1515 for non-stat beta draw.  Front office notified.

## 2018-03-22 DIAGNOSIS — N898 Other specified noninflammatory disorders of vagina: Secondary | ICD-10-CM | POA: Diagnosis not present

## 2018-03-22 DIAGNOSIS — N83299 Other ovarian cyst, unspecified side: Secondary | ICD-10-CM | POA: Diagnosis not present

## 2018-03-22 DIAGNOSIS — N939 Abnormal uterine and vaginal bleeding, unspecified: Secondary | ICD-10-CM | POA: Diagnosis not present

## 2018-03-22 DIAGNOSIS — Z113 Encounter for screening for infections with a predominantly sexual mode of transmission: Secondary | ICD-10-CM | POA: Diagnosis not present

## 2018-03-26 ENCOUNTER — Other Ambulatory Visit: Payer: Self-pay | Admitting: *Deleted

## 2018-03-26 DIAGNOSIS — O3680X0 Pregnancy with inconclusive fetal viability, not applicable or unspecified: Secondary | ICD-10-CM

## 2018-03-28 ENCOUNTER — Other Ambulatory Visit: Payer: BLUE CROSS/BLUE SHIELD

## 2019-03-21 ENCOUNTER — Other Ambulatory Visit: Payer: Self-pay | Admitting: Obstetrics and Gynecology

## 2019-03-21 DIAGNOSIS — O3680X0 Pregnancy with inconclusive fetal viability, not applicable or unspecified: Secondary | ICD-10-CM

## 2019-04-25 IMAGING — US US OB < 14 WEEKS - US OB TV
1 series · 15 of 28 positions shown · non-contrast
Comparison: Ultrasound pelvis 12/14/2016

CLINICAL DATA: Heavy vaginal bleeding and abdominal pain. Bleeding
for 2 weeks. Estimated gestational age by LMP is 2 weeks 0 days.
Quantitative beta HCG is [DATE], rising from [DATE] on [DATE] and 672
on [DATE].

EXAM:
OBSTETRIC <14 WK US AND TRANSVAGINAL OB US
TECHNIQUE: Both transabdominal and transvaginal ultrasound examinations were
performed for complete evaluation of the gestation as well as the
maternal uterus, adnexal regions, and pelvic cul-de-sac.
Transvaginal technique was performed to assess early pregnancy.

[Series 1: us ob < 14 weeks - us ob tv · 59 acquisitions, 15 frames shown]
[im 1/59]
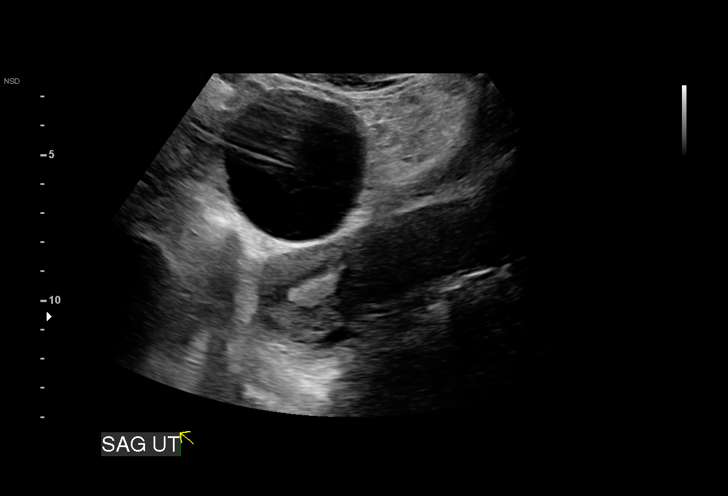
[im 5/59]
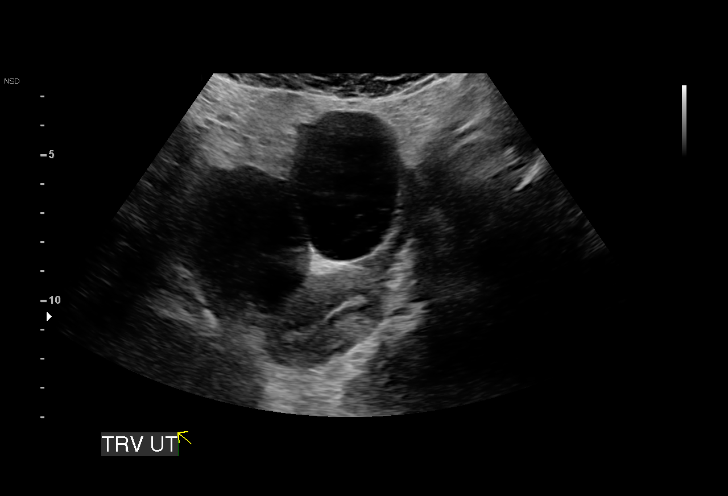
[im 9/59]
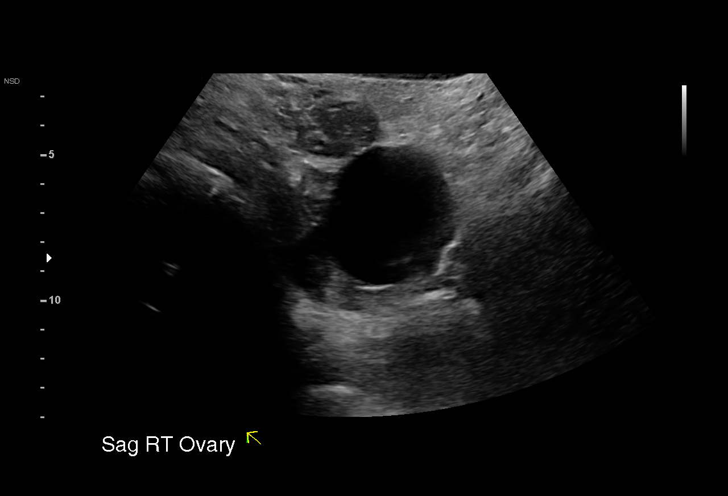
[im 13/59]
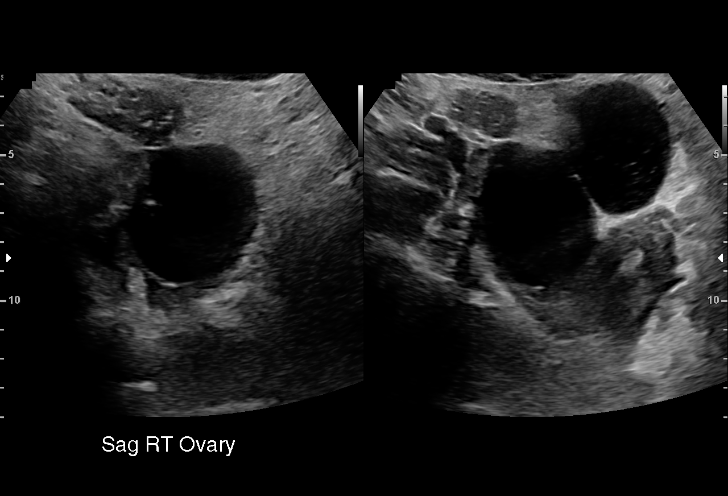
[im 18/59]
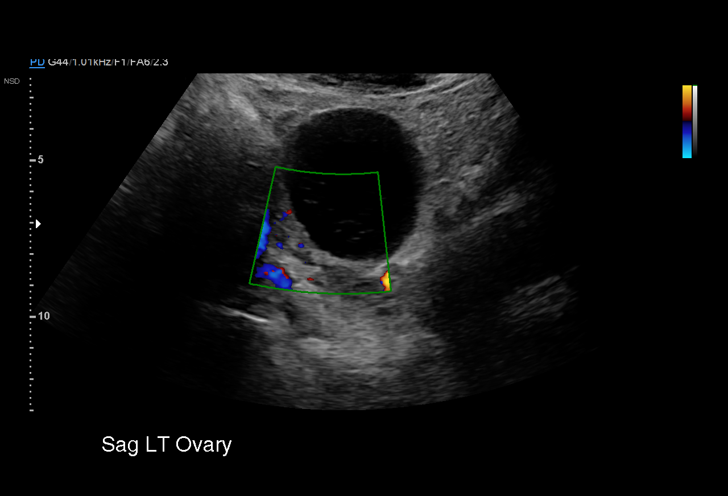
[im 22/59]
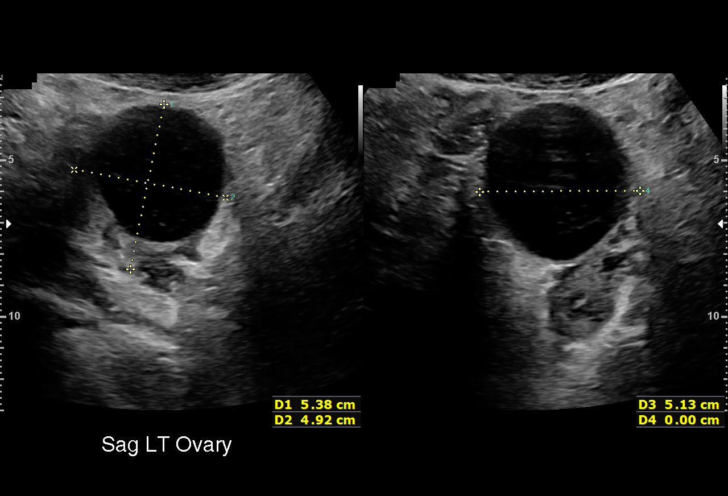
[im 26/59]
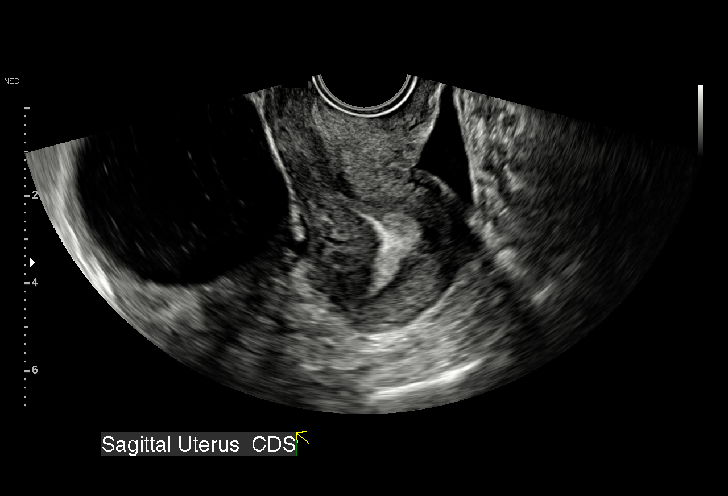
[im 31/59]
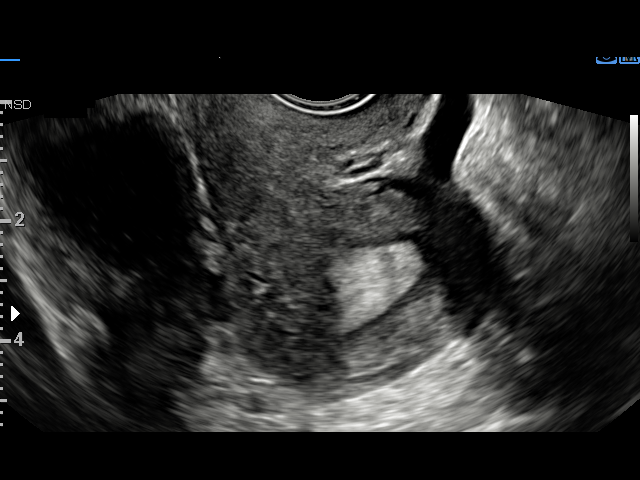
[im 33/59]
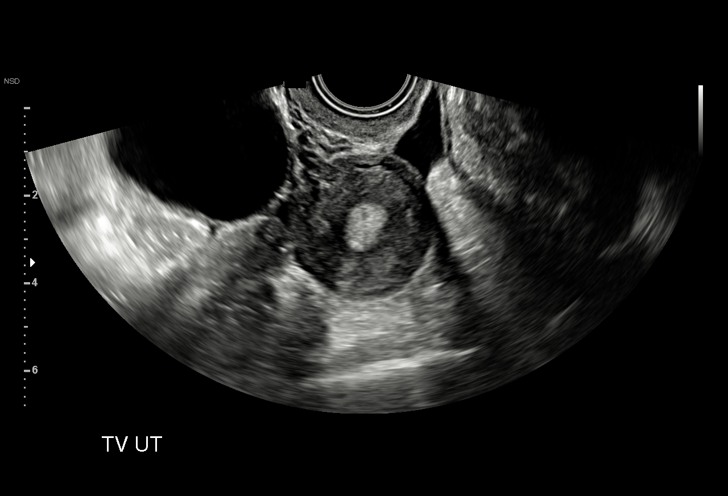
[im 37/59]
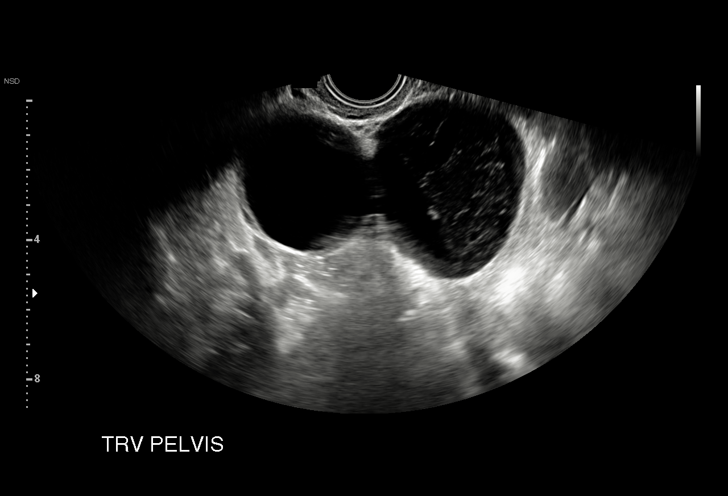
[im 41/59]
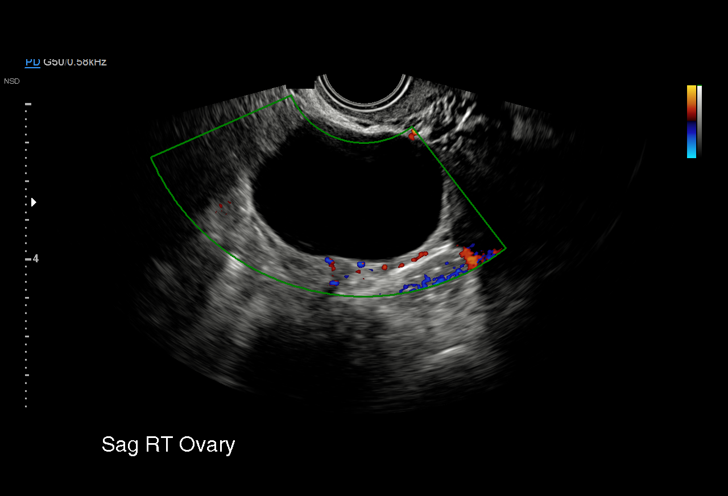
[im 46/59]
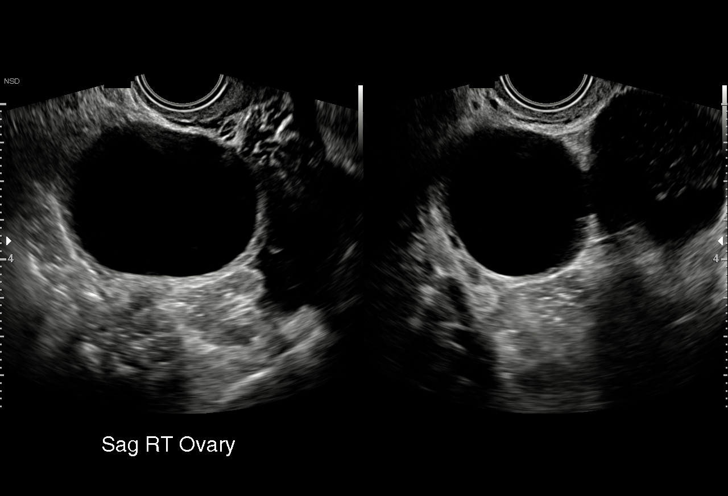
[im 50/59]
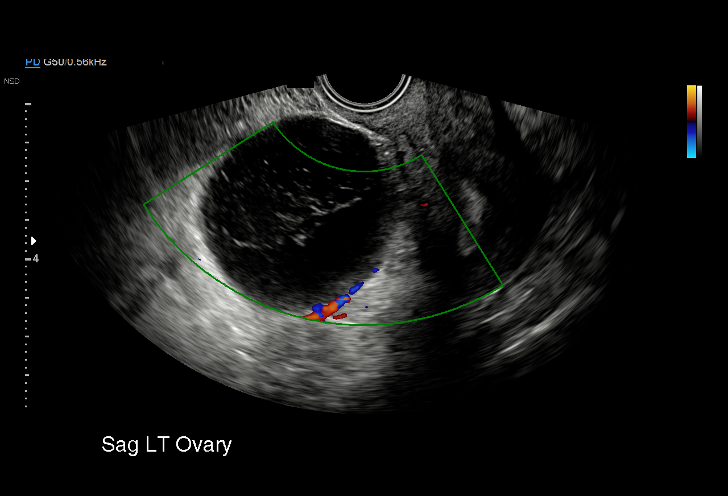
[im 54/59]
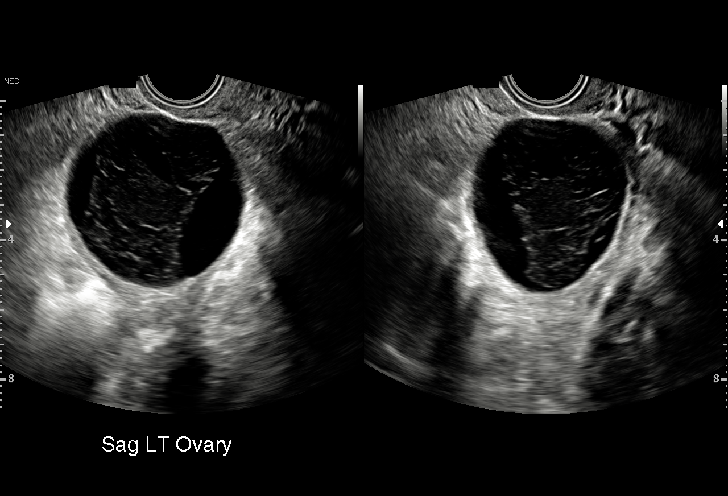
[im 59/59]
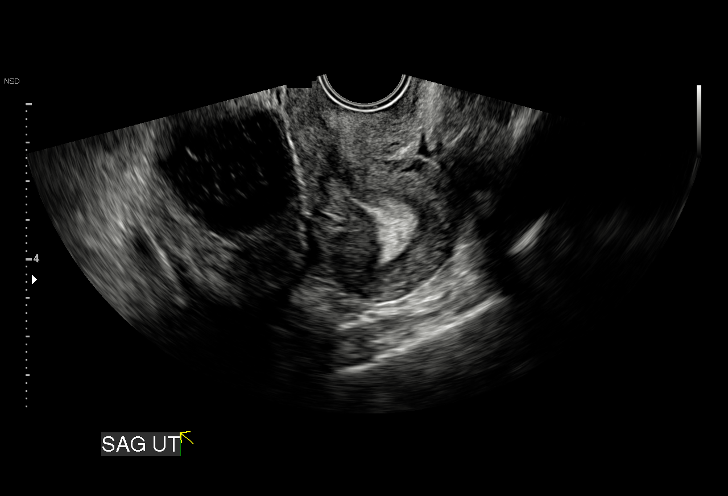

[15 of 28 positions shown; findings below may reference images not displayed]

FINDINGS: Intrauterine gestational sac: None

Yolk sac:  Not Visualized.

Embryo:  Not Visualized.

Cardiac Activity: Not Visualized.

Maternal uterus/adnexae:

Uterus: Uterus is retroflexed and retroverted. Visualization is
somewhat limited due to position. No endometrial fluid collections
identified. Endometrial stripe thickness measures about 7 mm. No
myometrial mass lesions.

Right ovary measures 5.3 x 5.2 x 4.3 cm and contains a simple cyst
measuring 5 cm maximal diameter. Cyst is new since previous study.
Flow is demonstrated in the right ovarian tissue on color flow
Doppler imaging.

Left ovary measures 5.7 x 6.5 x 5.2 cm and contains a complex cystic
structure with lace-like internal echoes measuring 5.2 cm maximal
diameter. Appearance is consistent with a hemorrhagic cyst. Cyst is
new since previous study. Flow is demonstrated within the left
ovarian tissue on color flow Doppler imaging.

Small amount of free fluid in the pelvis.
IMPRESSION: 1. No intrauterine gestational sac, yolk sac, or fetal pole
identified. Differential considerations include intrauterine
pregnancy too early to be sonographically visualized, missed
abortion, or ectopic pregnancy. Followup ultrasound is recommended
in 10-14 days for further evaluation.
2. 5 cm diameter simple appearing cyst in the right ovary, likely
physiologic.
3. 5.2 cm diameter complex cyst in the left ovary consistent with
hemorrhagic cyst, likely physiologic.

## 2019-04-30 IMAGING — US US OB TRANSVAGINAL
1 series · 15 of 28 positions shown · non-contrast
Comparison: 02/03/2018

CLINICAL DATA: Vaginal bleeding and abdominal pain. 1st trimester
pregnancy of unknown anatomic location.

EXAM:
TRANSVAGINAL OB ULTRASOUND
TECHNIQUE: Transvaginal ultrasound was performed for complete evaluation of the
gestation as well as the maternal uterus, adnexal regions, and
pelvic cul-de-sac.

[Series 1: us ob transvaginal · 15 of 31 slices shown]
[im 1/31]
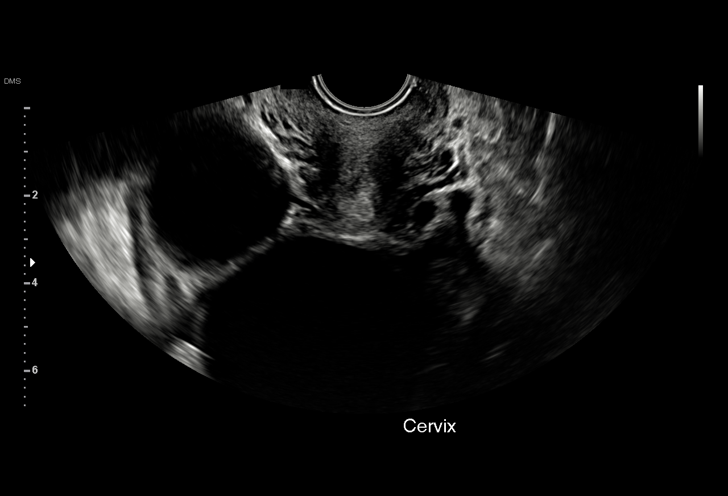
[im 3/31]
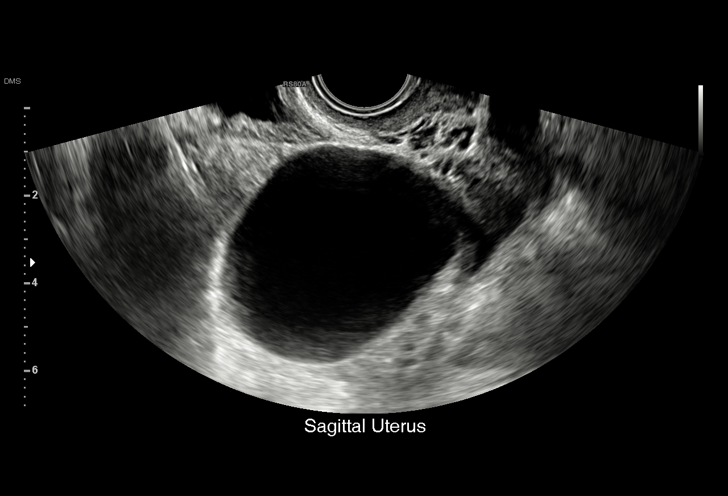
[im 5/31]
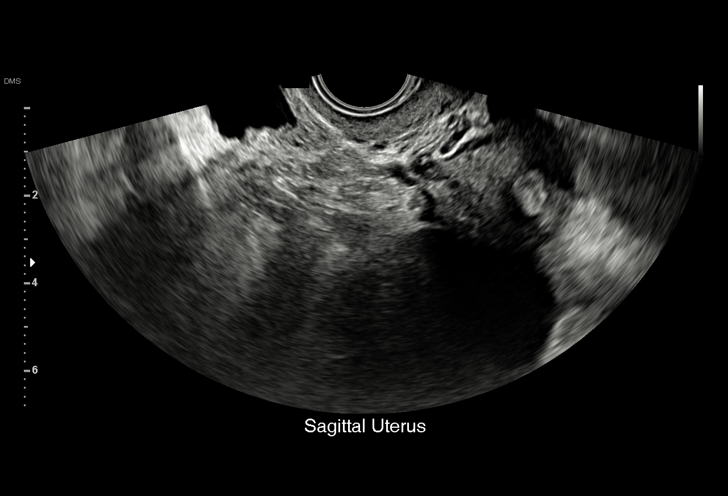
[im 7/31]
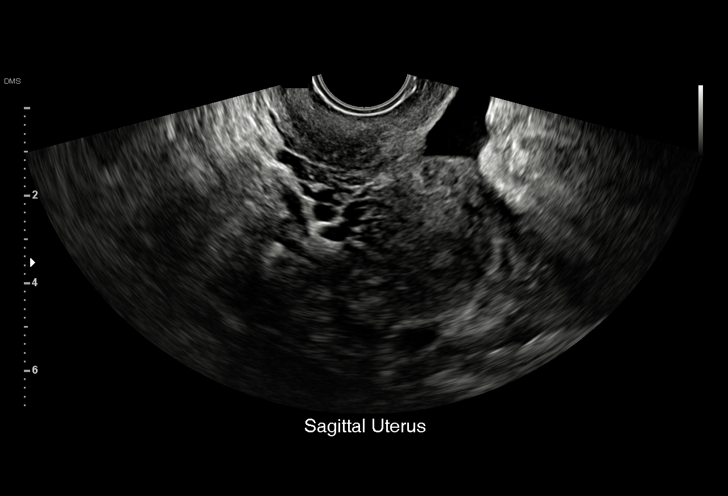
[im 9/31]
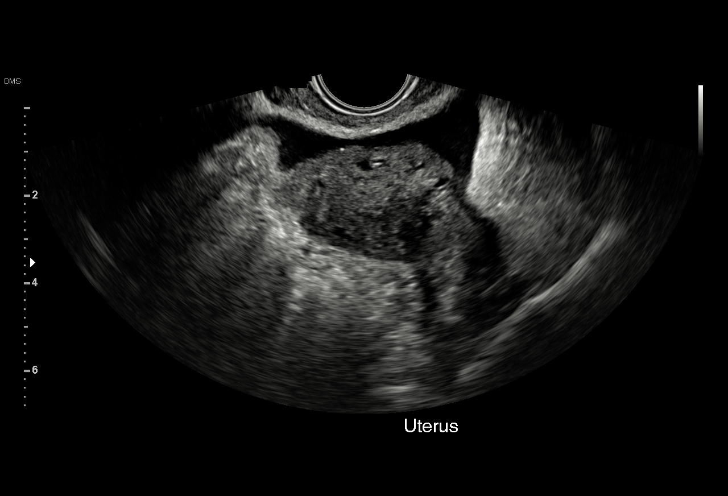
[im 12/31]
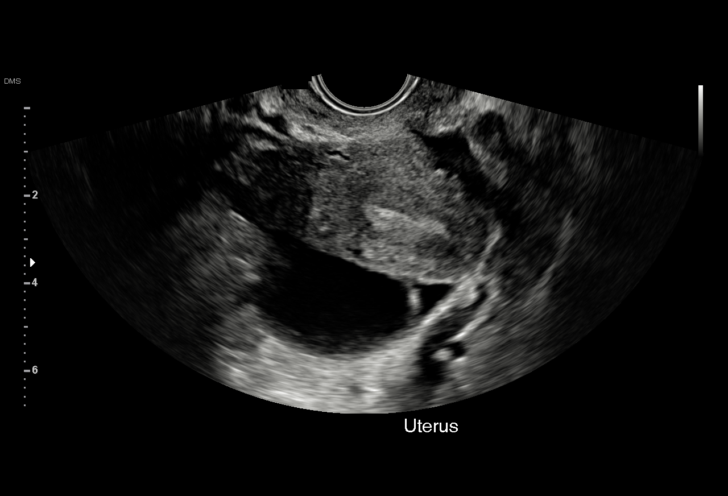
[im 14/31]
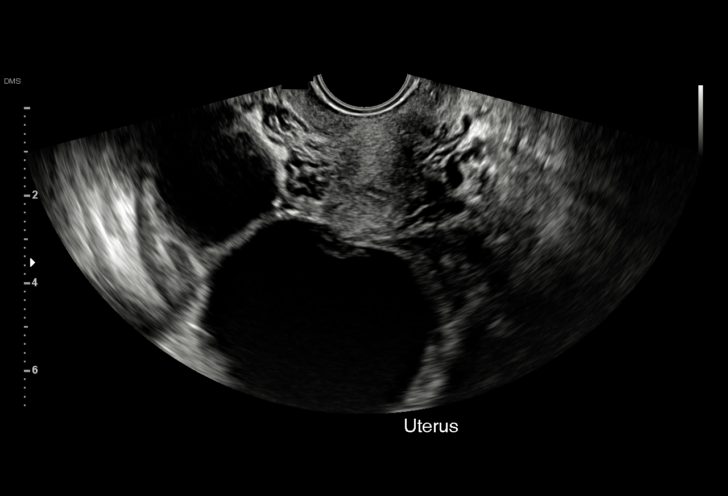
[im 16/31]
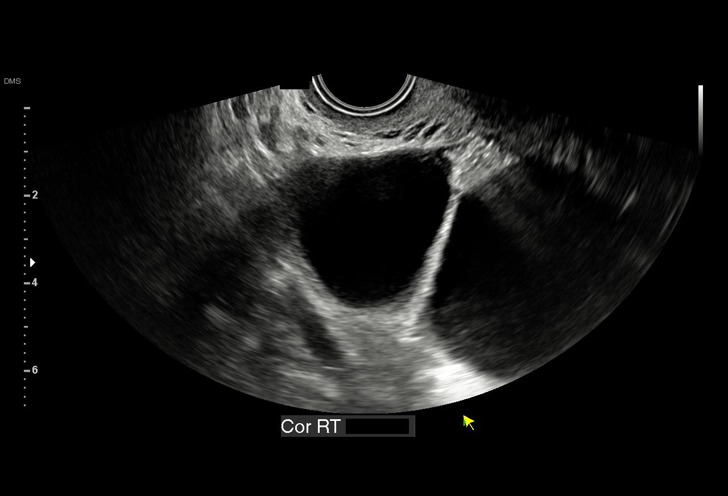
[im 17/31]
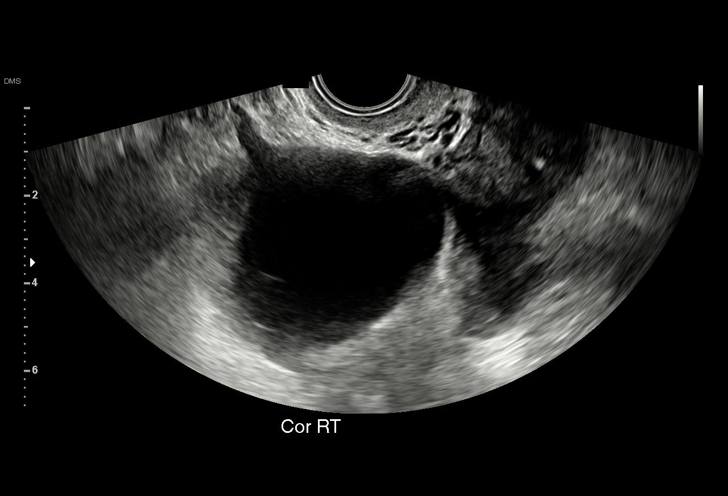
[im 19/31]
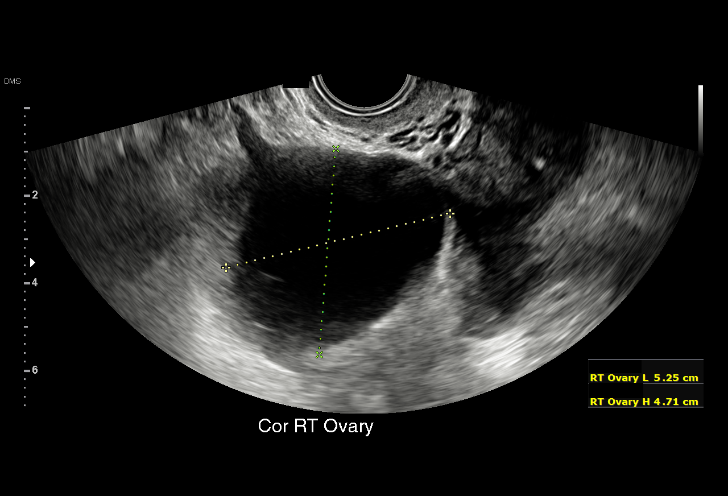
[im 22/31]
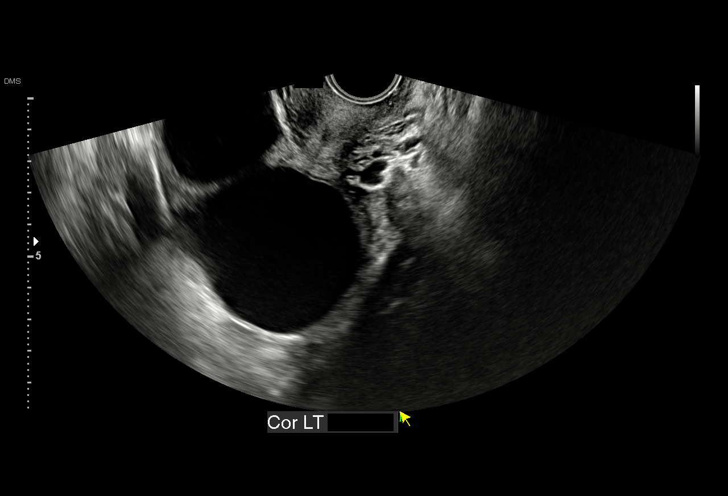
[im 24/31]
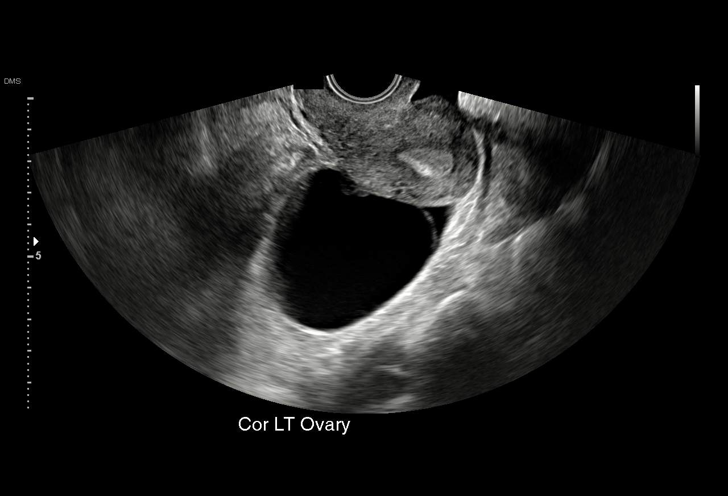
[im 26/31]
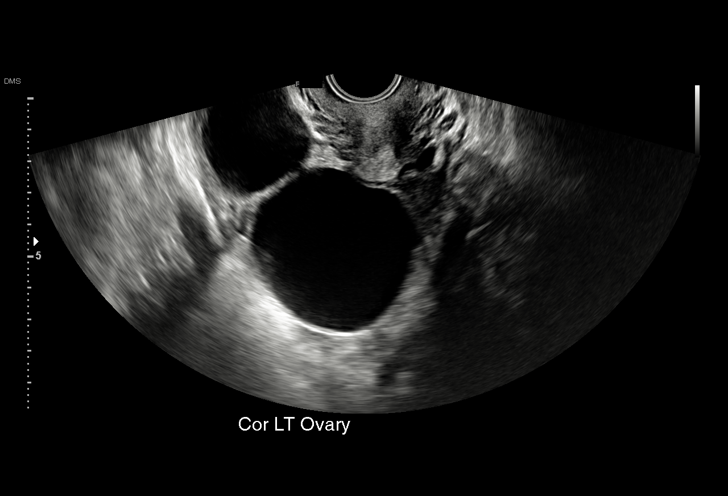
[im 28/31]
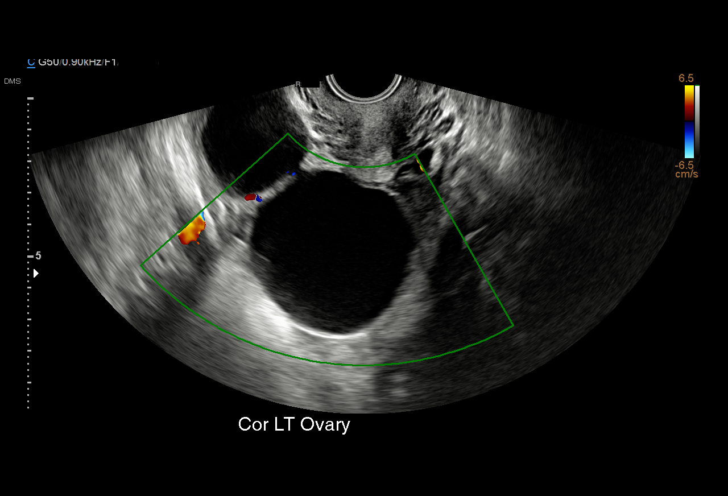
[im 31/31]
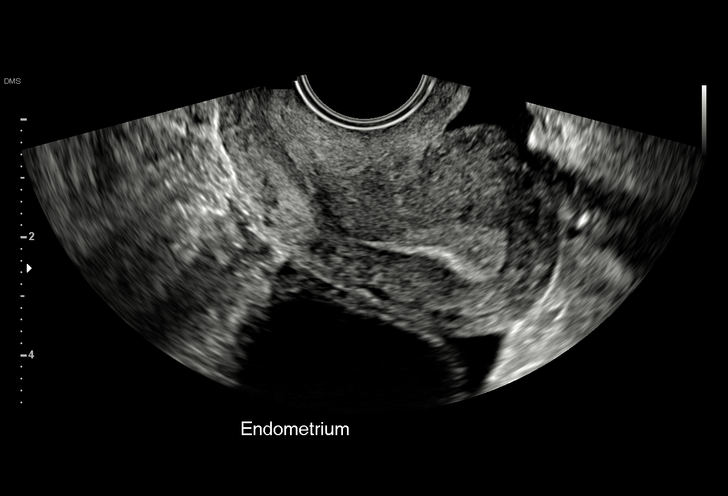

[15 of 28 positions shown; findings below may reference images not displayed]

FINDINGS: Intrauterine gestational sac: None

Maternal uterus/adnexae: Retroverted uterus. No fibroids identified.
A bilobed cystic lesion is again seen in the right adnexa which
measures approximately 9.3 x 5.2 cm. This shows no solid component
and significant change compared to previous study. Differential
diagnosis includes hydrosalpinx and cystic ovarian lesion. Small
amount of simple free fluid noted.
IMPRESSION: No IUP visualized. Pregnancy of unknown anatomic location if B-hCG
remains positive.

9.3 cm bilobed cystic lesion in the right adnexa, with probably
benign characteristics. No significant change since previous study.
Differential diagnosis includes hydrosalpinx and cystic ovarian
lesion. Continued follow-up by ultrasound is recommended in 6-8
weeks.
